# Patient Record
Sex: Female | Born: 1975 | Race: Black or African American | Hispanic: No | Smoking: Never smoker
Health system: Southern US, Community
[De-identification: ages and names within clinical notes are randomized; demographics above are authoritative.]

## PROBLEM LIST (undated history)

## (undated) DIAGNOSIS — I10 Essential (primary) hypertension: Secondary | ICD-10-CM

## (undated) HISTORY — PX: CHOLECYSTECTOMY: SHX55

## (undated) HISTORY — PX: BACK SURGERY: SHX140

---

## 2011-07-19 ENCOUNTER — Emergency Department (HOSPITAL_BASED_OUTPATIENT_CLINIC_OR_DEPARTMENT_OTHER)
Admission: EM | Admit: 2011-07-19 | Discharge: 2011-07-19 | Disposition: A | Discharge status: Home / Self Care | Payer: Self-pay | Attending: Emergency Medicine | Admitting: Emergency Medicine

## 2011-07-19 ENCOUNTER — Encounter: Payer: Self-pay | Admitting: *Deleted

## 2011-07-19 DIAGNOSIS — A599 Trichomoniasis, unspecified: Secondary | ICD-10-CM | POA: Insufficient documentation

## 2011-07-19 DIAGNOSIS — R109 Unspecified abdominal pain: Secondary | ICD-10-CM | POA: Insufficient documentation

## 2011-07-19 LAB — URINALYSIS, ROUTINE W REFLEX MICROSCOPIC
Ketones, ur: NEGATIVE mg/dL
Nitrite: POSITIVE — AB
Protein, ur: NEGATIVE mg/dL

## 2011-07-19 LAB — GC/CHLAMYDIA PROBE AMP, GENITAL
Chlamydia, DNA Probe: NEGATIVE
GC Probe Amp, Genital: NEGATIVE

## 2011-07-19 LAB — URINE MICROSCOPIC-ADD ON

## 2011-07-19 MED ORDER — DOXYCYCLINE HYCLATE 100 MG PO CAPS: 100.0000 mg | ORAL_CAPSULE | Freq: Two times a day (BID) | ORAL | Status: AC

## 2011-07-19 MED ORDER — DOXYCYCLINE HYCLATE 100 MG PO TABS
100.0000 mg | ORAL_TABLET | Freq: Once | ORAL | Status: AC
  Administered 2011-07-19: 100 mg via ORAL
  Filled 2011-07-19: qty 1

## 2011-07-19 MED ORDER — METRONIDAZOLE 500 MG PO TABS
2000.0000 mg | ORAL_TABLET | Freq: Once | ORAL | Status: AC
  Administered 2011-07-19: 2000 mg via ORAL
  Filled 2011-07-19: qty 4

## 2011-07-19 MED ORDER — LIDOCAINE HCL (PF) 1 % IJ SOLN
INTRAMUSCULAR | Status: AC
  Administered 2011-07-19: 04:00:00 via INTRAMUSCULAR
  Filled 2011-07-19: qty 5

## 2011-07-19 MED ORDER — CEFTRIAXONE SODIUM 250 MG IJ SOLR
250.0000 mg | INTRAMUSCULAR | Status: DC
  Administered 2011-07-19 (×2): 250 mg via INTRAMUSCULAR
  Filled 2011-07-19: qty 250

## 2011-07-19 NOTE — ED Provider Notes (Signed)
History     CSN: [id number] Arrival date & time: 07/19/2011  1:42 AM  Chief Complaint  Patient presents with  . Abdominal Cramping    (Consider location/radiation/quality/duration/timing/severity/associated sxs/prior treatment) HPI This 35 year old female has 3-5 days of intermittent suprapubic crampy abdominal pain which lasts up to an hour at a time without radiation or associated symptoms. She at baseline has a mild vaginal discharge and is unsure whether or not that is worse than usual or not. She has no vaginal bleeding and no dysuria. She is no fever no rash no dizziness chest pain or shortness of breath. She has no other concerns at this time. She has not had any joint swelling or joint redness. History reviewed. No pertinent past medical history.  Chole History reviewed. No pertinent family history.  History  Substance Use Topics  . Smoking status: Never Smoker   . Smokeless tobacco: Not on file  . Alcohol Use: No    OB History    Grav Para Term Preterm Abortions TAB SAB Ect Mult Living                  Review of Systems  Constitutional: Negative for fever.       10 Systems reviewed and are negative for acute change except as noted in the HPI.  HENT: Negative for congestion.   Eyes: Negative for discharge and redness.  Respiratory: Negative for cough and shortness of breath.   Cardiovascular: Negative for chest pain.  Gastrointestinal: Positive for abdominal pain. Negative for nausea, vomiting and diarrhea.  Genitourinary: Positive for vaginal discharge. Negative for dysuria and vaginal bleeding.  Musculoskeletal: Negative for back pain.  Skin: Negative for rash.  Neurological: Negative for syncope, numbness and headaches.  Psychiatric/Behavioral:       No behavior change.    Allergies  Review of patient's allergies indicates no known allergies.  Home Medications   Current Outpatient Rx  Name Route Sig Dispense Refill  . DOXYCYCLINE HYCLATE 100 MG PO CAPS  Oral Take 1 capsule (100 mg total) by mouth 2 (two) times daily. One po bid x 7 days 14 capsule 0    BP 177/129  Pulse 64  Temp(Src) 98.1 F (36.7 C) (Oral)  Resp 16  Ht 5\' 4"  (1.626 m)  Wt 170 lb (77.111 kg)  BMI 29.18 kg/m2  SpO2 99%  LMP 06/20/2011  Physical Exam  Nursing note and vitals reviewed. Constitutional:       Awake, alert, nontoxic appearance.  HENT:  Head: Atraumatic.  Eyes: Right eye exhibits no discharge. Left eye exhibits no discharge.  Neck: Neck supple.  Cardiovascular: Normal rate, regular rhythm and normal heart sounds.   No murmur heard. Pulmonary/Chest: Effort normal and breath sounds normal. No respiratory distress. She has no wheezes. She has no rales. She exhibits no tenderness.  Abdominal: Soft. Bowel sounds are normal. She exhibits no mass. There is tenderness. There is no rebound and no guarding.       She has minimal suprapubic tenderness only the rest of the abdomen including McBurney's point is nontender  Musculoskeletal: She exhibits no tenderness.       Baseline ROM, no obvious new focal weakness.  Neurological:       Mental status and motor strength appears baseline for patient and situation.  Skin: No rash noted.  Psychiatric: She has a normal mood and affect.  GU:  A chaperone was present for the pelvic examination which revealed minimal white vaginal discharge with no cervical motion  tenderness or adnexal tenderness or masses palpated. There was no rash noted on the external genitalia.  ED Course  Procedures (including critical care time)  Labs Reviewed  URINALYSIS, ROUTINE W REFLEX MICROSCOPIC - Abnormal; Notable for the following:    Nitrite POSITIVE (*)    Leukocytes, UA TRACE (*)    All other components within normal limits  URINE MICROSCOPIC-ADD ON - Abnormal; Notable for the following:    Bacteria, UA FEW (*)    All other components within normal limits  PREGNANCY, URINE  GC/CHLAMYDIA PROBE AMP, GENITAL   No results  found.   1. Trichimoniasis       MDM  I doubt any other EMC precluding discharge at this time including, but not necessarily limited to the following:sepsis, PID, peritonitis.        Hurman Horn, MD 07/19/11 [phone]

## 2011-07-19 NOTE — ED Notes (Signed)
C/o lower abd cramping x 4 days, pt states having normal vaginal d/c

## 2011-07-19 NOTE — ED Notes (Signed)
MD at bedside. 

## 2011-07-19 NOTE — ED Notes (Signed)
Pt c/o intermittent lower abd cramping x 4 days. Pt states she is having normal vaginal d/c and denies odor.

## 2011-07-19 NOTE — ED Notes (Signed)
Pt was only given one dose of IM rocephin 250 mg per MD order.

## 2013-01-01 ENCOUNTER — Encounter (HOSPITAL_BASED_OUTPATIENT_CLINIC_OR_DEPARTMENT_OTHER): Payer: Self-pay | Admitting: *Deleted

## 2013-01-01 ENCOUNTER — Emergency Department (HOSPITAL_BASED_OUTPATIENT_CLINIC_OR_DEPARTMENT_OTHER)
Admission: EM | Admit: 2013-01-01 | Discharge: 2013-01-01 | Disposition: A | Discharge status: Home / Self Care | Payer: Self-pay | Attending: Emergency Medicine | Admitting: Emergency Medicine

## 2013-01-01 ENCOUNTER — Emergency Department (HOSPITAL_BASED_OUTPATIENT_CLINIC_OR_DEPARTMENT_OTHER): Payer: Self-pay

## 2013-01-01 DIAGNOSIS — R42 Dizziness and giddiness: Secondary | ICD-10-CM | POA: Insufficient documentation

## 2013-01-01 DIAGNOSIS — I10 Essential (primary) hypertension: Secondary | ICD-10-CM | POA: Insufficient documentation

## 2013-01-01 DIAGNOSIS — R079 Chest pain, unspecified: Secondary | ICD-10-CM | POA: Insufficient documentation

## 2013-01-01 HISTORY — DX: Essential (primary) hypertension: I10

## 2013-01-01 LAB — BASIC METABOLIC PANEL
GFR calc non Af Amer: >90 mL/min (ref >90)
Glucose, Bld: 100 mg/dL — ABNORMAL HIGH (ref 70–99)
Potassium: 3.7 mEq/L (ref 3.5–5.1)
Sodium: 139 mEq/L (ref 135–145)

## 2013-01-01 LAB — CBC
Hemoglobin: 13 g/dL (ref 12.0–15.0)
MCHC: 34.7 g/dL (ref 30.0–36.0)
Platelets: 242 10*3/uL (ref 150–400)
RBC: 4.12 MIL/uL (ref 3.87–5.11)

## 2013-01-01 LAB — TROPONIN I: Troponin I: <0.3 ng/mL (ref <0.30)

## 2013-01-01 MED ORDER — LISINOPRIL 10 MG PO TABS: 10.0000 mg | ORAL_TABLET | Freq: Every day | ORAL | Status: DC

## 2013-01-01 MED ORDER — MORPHINE SULFATE 4 MG/ML IJ SOLN
4.0000 mg | Freq: Once | INTRAMUSCULAR | Status: AC
  Administered 2013-01-01: 4 mg via INTRAVENOUS
  Filled 2013-01-01: qty 1

## 2013-01-01 MED ORDER — ONDANSETRON HCL 4 MG/2ML IJ SOLN
4.0000 mg | Freq: Once | INTRAMUSCULAR | Status: AC
Start: 2013-01-01 — End: 2013-01-01
  Administered 2013-01-01: 4 mg via INTRAVENOUS
  Filled 2013-01-01: qty 2

## 2013-01-01 NOTE — ED Notes (Addendum)
Pt states she started having chest pain 2 weeks ago. States she feels "fluttering and pinching" in her chest.Pt states she felt n/v along with dizziness and lightheadedness last night but states she no longer feels dizzy or nauseous. Pt states she went to the ED Monday morning due to current symptoms and blood pressure was elevated but all results were normal.
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
## 2013-01-01 NOTE — ED Notes (Signed)
Patient changed into gown.

## 2013-01-01 NOTE — ED Provider Notes (Signed)
Medical screening examination/treatment/procedure(s) were performed by non-physician practitioner and as supervising physician I was immediately available for consultation/collaboration.   Richardean Canal, MD 01/01/13 [phone]

## 2013-01-01 NOTE — ED Provider Notes (Signed)
History     CSN: [id number]  Arrival date & time 01/01/13  Rickey Primus   First MD Initiated Contact with Patient 01/01/13 1850      Chief Complaint  Patient presents with  . Chest Pain    (Consider location/radiation/quality/duration/timing/severity/associated sxs/prior treatment) HPI Comments: Pt states that she has had pinching and flutttering in her chest over the left 2 weeks:pt was seen in the er [facility] last night but went home and took some lisinopril and then in the middle of the the night she was dizzy and nauseated:pt states that she hasn't been on bp medication in months  Patient is a 37 y.o. female presenting with chest pain. The history is provided by the patient. No language interpreter was used.  Chest Pain Pain location:  L chest Pain quality comment:  Pinching Pain radiates to:  Does not radiate Pain radiates to the back: no   Pain severity:  Mild Onset quality:  Gradual Duration:  2 weeks Timing:  Intermittent Progression:  Unchanged Context: not breathing and no drug use   Relieved by:  Nothing Worsened by:  Nothing tried Ineffective treatments:  None tried Associated symptoms: dizziness   Associated symptoms: no abdominal pain, no fever, no headache and no numbness     Past Medical History  Diagnosis Date  . Hypertension     Past Surgical History  Procedure Laterality Date  . Cholecystectomy      No family history on file.  History  Substance Use Topics  . Smoking status: Never Smoker   . Smokeless tobacco: Not on file  . Alcohol Use: No    OB History   Grav Para Term Preterm Abortions TAB SAB Ect Mult Living                  Review of Systems  Constitutional: Negative for fever.  Cardiovascular: Positive for chest pain.  Gastrointestinal: Negative for abdominal pain.  Neurological: Positive for dizziness. Negative for numbness and headaches.    Allergies  Review of patient's allergies indicates no known allergies.  Home Medications   No current outpatient prescriptions on file.  BP 164/102  Pulse 73  Resp 14  Wt 173 lb (78.472 kg)  BMI 29.68 kg/m2  SpO2 100%  LMP 12/29/2012  Physical Exam  Nursing note and vitals reviewed. Constitutional: She is oriented to person, place, and time. She appears well-developed and well-nourished.  HENT:  Head: Normocephalic and atraumatic.  Right Ear: External ear normal.  Left Ear: External ear normal.  Eyes: Conjunctivae and EOM are normal. Pupils are equal, round, and reactive to light.  Neck: Normal range of motion. Neck supple.  Cardiovascular: Normal rate and regular rhythm.   Pulmonary/Chest: Effort normal and breath sounds normal. She exhibits no tenderness.  Abdominal: Soft. Bowel sounds are normal.  Musculoskeletal: Normal range of motion.  Neurological: She is alert and oriented to person, place, and time.  Skin: Skin is warm and dry.  Psychiatric: She has a normal mood and affect.    ED Course  Procedures (including critical care time)  Labs Reviewed  BASIC METABOLIC PANEL - Abnormal; Notable for the following:    Glucose, Bld 100 (*)    All other components within normal limits  CBC  TROPONIN I   Dg Chest 2 View  01/01/2013  *RADIOLOGY REPORT*  Clinical Data: 2-week history of palpitations and dizziness.  CHEST - 2 VIEW  Comparison: None.  Findings: Suboptimal inspiration due to body habitus which accounts for  crowded bronchovascular markings at the bases and accentuates the cardiac silhouette.  Taking this into account, cardiomediastinal silhouette unremarkable.  Lungs clear. Bronchovascular markings normal.  Pulmonary vascularity normal.  No pneumothorax.  No pleural effusions.  Visualized bony thorax intact.  IMPRESSION: Suboptimal inspiration.  No acute cardiopulmonary disease.   Original Report Authenticated By: Hulan Saas, M.D.    01/01/2013  Date: 01/01/2013  Rate: 69  Rhythm: sinus arrhythmia  QRS Axis: normal  Intervals: normal  ST/T Wave  abnormalities: normal  Conduction Disutrbances:none  Narrative Interpretation:   Old EKG Reviewed: none available    1. Chest pain   2. HTN (hypertension)       MDM  Doubt cardiac in nature:pt is cp free at this time:pt symptoms have been on going for 2 weeks;1 troponin sufficient:will give script for htn        Teressa Lower, NP 01/01/13 2036

## 2013-01-01 NOTE — ED Notes (Signed)
States she has been having fluttering in her chest x 2 weeks. Was seen at [facility] yesterday and everything was negative. Last night she started having dizziness. She woke lightheaded. None now.

## 2015-05-19 ENCOUNTER — Emergency Department (HOSPITAL_BASED_OUTPATIENT_CLINIC_OR_DEPARTMENT_OTHER)
Admission: EM | Admit: 2015-05-19 | Discharge: 2015-05-20 | Disposition: A | Discharge status: Home / Self Care | Payer: Self-pay | Attending: Emergency Medicine | Admitting: Emergency Medicine

## 2015-05-19 ENCOUNTER — Emergency Department (HOSPITAL_BASED_OUTPATIENT_CLINIC_OR_DEPARTMENT_OTHER): Payer: Self-pay

## 2015-05-19 ENCOUNTER — Encounter (HOSPITAL_BASED_OUTPATIENT_CLINIC_OR_DEPARTMENT_OTHER): Payer: Self-pay | Admitting: Emergency Medicine

## 2015-05-19 DIAGNOSIS — Y998 Other external cause status: Secondary | ICD-10-CM | POA: Insufficient documentation

## 2015-05-19 DIAGNOSIS — S060X0A Concussion without loss of consciousness, initial encounter: Secondary | ICD-10-CM | POA: Insufficient documentation

## 2015-05-19 DIAGNOSIS — I10 Essential (primary) hypertension: Secondary | ICD-10-CM | POA: Insufficient documentation

## 2015-05-19 DIAGNOSIS — Y9289 Other specified places as the place of occurrence of the external cause: Secondary | ICD-10-CM | POA: Insufficient documentation

## 2015-05-19 DIAGNOSIS — Y9389 Activity, other specified: Secondary | ICD-10-CM | POA: Insufficient documentation

## 2015-05-19 DIAGNOSIS — W2189XA Striking against or struck by other sports equipment, initial encounter: Secondary | ICD-10-CM | POA: Insufficient documentation

## 2015-05-19 MED ORDER — DIPHENHYDRAMINE HCL 50 MG/ML IJ SOLN
25.0000 mg | Freq: Once | INTRAMUSCULAR | Status: AC
  Administered 2015-05-19: 25 mg via INTRAVENOUS
  Filled 2015-05-19: qty 1

## 2015-05-19 MED ORDER — SODIUM CHLORIDE 0.9 % IV BOLUS (SEPSIS)
1000.0000 mL | Freq: Once | INTRAVENOUS | Status: AC
  Administered 2015-05-19: 1000 mL via INTRAVENOUS

## 2015-05-19 MED ORDER — METOCLOPRAMIDE HCL 5 MG/ML IJ SOLN
10.0000 mg | Freq: Once | INTRAMUSCULAR | Status: AC
  Administered 2015-05-19: 10 mg via INTRAVENOUS
  Filled 2015-05-19: qty 2
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
## 2015-05-19 NOTE — ED Notes (Signed)
Reports right eye vision is "cloudy"
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
## 2015-05-20 NOTE — Discharge Instructions (Signed)
Your blood pressure was elevated today - please follow up with your family doctor for recheck, you may need to restart your blood pressure medications.     Concussion A concussion, or closed-head injury, is a brain injury caused by a direct blow to the head or by a quick and sudden movement (jolt) of the head or neck. Concussions are usually not life-threatening. Even so, the effects of a concussion can be serious. If you have had a concussion before, you are more likely to experience concussion-like symptoms after a direct blow to the head.  CAUSES  Direct blow to the head, such as from running into another player during a soccer game, being hit in a fight, or hitting your head on a hard surface.  A jolt of the head or neck that causes the brain to move back and forth inside the skull, such as in a car crash. SIGNS AND SYMPTOMS The signs of a concussion can be hard to notice. Early on, they may be missed by you, family members, and health care providers. You may look fine but act or feel differently. Symptoms are usually temporary, but they may last for days, weeks, or even longer. Some symptoms may appear right away while others may not show up for hours or days. Every head injury is different. Symptoms include:  Mild to moderate headaches that will not go away.  A feeling of pressure inside your head.  Having more trouble than usual:  Learning or remembering things you have heard.  Answering questions.  Paying attention or concentrating.  Organizing daily tasks.  Making decisions and solving problems.  Slowness in thinking, acting or reacting, speaking, or reading.  Getting lost or being easily confused.  Feeling tired all the time or lacking energy (fatigued).  Feeling drowsy.  Sleep disturbances.  Sleeping more than usual.  Sleeping less than usual.  Trouble falling asleep.  Trouble sleeping (insomnia).  Loss of balance or feeling lightheaded or dizzy.  Nausea  or vomiting.  Numbness or tingling.  Increased sensitivity to:  Sounds.  Lights.  Distractions.  Vision problems or eyes that tire easily.  Diminished sense of taste or smell.  Ringing in the ears.  Mood changes such as feeling sad or anxious.  Becoming easily irritated or angry for little or no reason.  Lack of motivation.  Seeing or hearing things other people do not see or hear (hallucinations). DIAGNOSIS Your health care provider can usually diagnose a concussion based on a description of your injury and symptoms. He or she will ask whether you passed out (lost consciousness) and whether you are having trouble remembering events that happened right before and during your injury. Your evaluation might include:  A brain scan to look for signs of injury to the brain. Even if the test shows no injury, you may still have a concussion.  Blood tests to be sure other problems are not present. TREATMENT  Concussions are usually treated in an emergency department, in urgent care, or at a clinic. You may need to stay in the hospital overnight for further treatment.  Tell your health care provider if you are taking any medicines, including prescription medicines, over-the-counter medicines, and natural remedies. Some medicines, such as blood thinners (anticoagulants) and aspirin, may increase the chance of complications. Also tell your health care provider whether you have had alcohol or are taking illegal drugs. This information may affect treatment.  Your health care provider will send you home with important instructions to follow.  How fast you will recover from a concussion depends on many factors. These factors include how severe your concussion is, what part of your brain was injured, your age, and how healthy you were before the concussion.  Most people with mild injuries recover fully. Recovery can take time. In general, recovery is slower in older persons. Also, persons  who have had a concussion in the past or have other medical problems may find that it takes longer to recover from their current injury. HOME CARE INSTRUCTIONS General Instructions  Carefully follow the directions your health care provider gave you.  Only take over-the-counter or prescription medicines for pain, discomfort, or fever as directed by your health care provider.  Take only those medicines that your health care provider has approved.  Do not drink alcohol until your health care provider says you are well enough to do so. Alcohol and certain other drugs may slow your recovery and can put you at risk of further injury.  If it is harder than usual to remember things, write them down.  If you are easily distracted, try to do one thing at a time. For example, do not try to watch TV while fixing dinner.  Talk with family members or close friends when making important decisions.  Keep all follow-up appointments. Repeated evaluation of your symptoms is recommended for your recovery.  Watch your symptoms and tell others to do the same. Complications sometimes occur after a concussion. Older adults with a brain injury may have a higher risk of serious complications, such as a blood clot on the brain.  Tell your teachers, school nurse, school counselor, coach, athletic trainer, or work Production designer, theatre/television/film about your injury, symptoms, and restrictions. Tell them about what you can or cannot do. They should watch for:  Increased problems with attention or concentration.  Increased difficulty remembering or learning new information.  Increased time needed to complete tasks or assignments.  Increased irritability or decreased ability to cope with stress.  Increased symptoms.  Rest. Rest helps the brain to heal. Make sure you:  Get plenty of sleep at night. Avoid staying up late at night.  Keep the same bedtime hours on weekends and weekdays.  Rest during the day. Take daytime naps or rest  breaks when you feel tired.  Limit activities that require a lot of thought or concentration. These include:  Doing homework or job-related work.  Watching TV.  Working on the computer.  Avoid any situation where there is potential for another head injury (football, hockey, soccer, basketball, martial arts, downhill snow sports and horseback riding). Your condition will get worse every time you experience a concussion. You should avoid these activities until you are evaluated by the appropriate follow-up health care providers. Returning To Your Regular Activities You will need to return to your normal activities slowly, not all at once. You must give your body and brain enough time for recovery.  Do not return to sports or other athletic activities until your health care provider tells you it is safe to do so.  Ask your health care provider when you can drive, ride a bicycle, or operate heavy machinery. Your ability to react may be slower after a brain injury. Never do these activities if you are dizzy.  Ask your health care provider about when you can return to work or school. Preventing Another Concussion It is very important to avoid another brain injury, especially before you have recovered. In rare cases, another injury can lead to permanent  brain damage, brain swelling, or death. The risk of this is greatest during the first 7-10 days after a head injury. Avoid injuries by:  Wearing a seat belt when riding in a car.  Drinking alcohol only in moderation.  Wearing a helmet when biking, skiing, skateboarding, skating, or doing similar activities.  Avoiding activities that could lead to a second concussion, such as contact or recreational sports, until your health care provider says it is okay.  Taking safety measures in your home.  Remove clutter and tripping hazards from floors and stairways.  Use grab bars in bathrooms and handrails by stairs.  Place non-slip mats on floors  and in bathtubs.  Improve lighting in dim areas. SEEK MEDICAL CARE IF:  You have increased problems paying attention or concentrating.  You have increased difficulty remembering or learning new information.  You need more time to complete tasks or assignments than before.  You have increased irritability or decreased ability to cope with stress.  You have more symptoms than before. Seek medical care if you have any of the following symptoms for more than 2 weeks after your injury:  Lasting (chronic) headaches.  Dizziness or balance problems.  Nausea.  Vision problems.  Increased sensitivity to noise or light.  Depression or mood swings.  Anxiety or irritability.  Memory problems.  Difficulty concentrating or paying attention.  Sleep problems.  Feeling tired all the time. SEEK IMMEDIATE MEDICAL CARE IF:  You have severe or worsening headaches. These may be a sign of a blood clot in the brain.  You have weakness (even if only in one hand, leg, or part of the face).  You have numbness.  You have decreased coordination.  You vomit repeatedly.  You have increased sleepiness.  One pupil is larger than the other.  You have convulsions.  You have slurred speech.  You have increased confusion. This may be a sign of a blood clot in the brain.  You have increased restlessness, agitation, or irritability.  You are unable to recognize people or places.  You have neck pain.  It is difficult to wake you up.  You have unusual behavior changes.  You lose consciousness. MAKE SURE YOU:  Understand these instructions.  Will watch your condition.  Will get help right away if you are not doing well or get worse. Document Released: 12/24/2003 Document Revised: 10/08/2013 Document Reviewed: 04/25/2013 [facility] Patient Information 2015 [place], [place]. This information is not intended to replace advice given to you by your health care provider. Make sure you  discuss any questions you have with your health care provider.

## 2016-04-09 ENCOUNTER — Emergency Department (HOSPITAL_BASED_OUTPATIENT_CLINIC_OR_DEPARTMENT_OTHER): Payer: Self-pay

## 2016-04-09 ENCOUNTER — Encounter (HOSPITAL_BASED_OUTPATIENT_CLINIC_OR_DEPARTMENT_OTHER): Payer: Self-pay | Admitting: Emergency Medicine

## 2016-04-09 ENCOUNTER — Other Ambulatory Visit: Payer: Self-pay

## 2016-04-09 DIAGNOSIS — I10 Essential (primary) hypertension: Secondary | ICD-10-CM | POA: Insufficient documentation

## 2016-04-09 DIAGNOSIS — M62838 Other muscle spasm: Secondary | ICD-10-CM | POA: Insufficient documentation

## 2016-04-09 DIAGNOSIS — R0789 Other chest pain: Secondary | ICD-10-CM | POA: Insufficient documentation

## 2016-04-09 DIAGNOSIS — Z7982 Long term (current) use of aspirin: Secondary | ICD-10-CM | POA: Insufficient documentation

## 2016-04-09 LAB — BASIC METABOLIC PANEL
ANION GAP: 7 (ref 5–15)
BUN: 13 mg/dL (ref 6–20)
CALCIUM: 9.2 mg/dL (ref 8.9–10.3)
CO2: 26 mmol/L (ref 22–32)
Chloride: 106 mmol/L (ref 101–111)
Creatinine, Ser: 0.75 mg/dL (ref 0.44–1.00)
GLUCOSE: 106 mg/dL — AB (ref 65–99)
POTASSIUM: 3.8 mmol/L (ref 3.5–5.1)
SODIUM: 139 mmol/L (ref 135–145)

## 2016-04-09 LAB — CBC
HEMATOCRIT: 35.8 % — AB (ref 36.0–46.0)
HEMOGLOBIN: 12.5 g/dL (ref 12.0–15.0)
MCH: 32.1 pg (ref 26.0–34.0)
MCHC: 34.9 g/dL (ref 30.0–36.0)
MCV: 91.8 fL (ref 78.0–100.0)
Platelets: 264 10*3/uL (ref 150–400)
RBC: 3.9 MIL/uL (ref 3.87–5.11)
RDW: 13.2 % (ref 11.5–15.5)
WBC: 5 10*3/uL (ref 4.0–10.5)

## 2016-04-09 LAB — TROPONIN I

## 2016-04-09 NOTE — ED Notes (Signed)
Patient reports chest pain that started two days ago. Patient states her pain is the left side of her chest, radiates into her left neck, and through her chest into her mid back. Patient describes pain as stabbing, 8/10. Patient states she had this same pain one other time in the past and [facility], is unable to state when this occurred. Patient is A&Ox4, NAD noted. Patient reports she was laying down when the pain started.

## 2016-04-10 ENCOUNTER — Emergency Department (HOSPITAL_BASED_OUTPATIENT_CLINIC_OR_DEPARTMENT_OTHER)
Admission: EM | Admit: 2016-04-10 | Discharge: 2016-04-10 | Disposition: A | Discharge status: Home / Self Care | Payer: Self-pay | Attending: Emergency Medicine | Admitting: Emergency Medicine

## 2016-04-10 DIAGNOSIS — M62838 Other muscle spasm: Secondary | ICD-10-CM

## 2016-04-10 DIAGNOSIS — R0789 Other chest pain: Secondary | ICD-10-CM

## 2016-04-10 LAB — TROPONIN I: Troponin I: <0.03 ng/mL (ref <0.031)

## 2016-04-10 MED ORDER — DICLOFENAC SODIUM ER 100 MG PO TB24: 100.0000 mg | ORAL_TABLET | Freq: Every day | ORAL | Status: DC

## 2016-04-10 MED ORDER — METHOCARBAMOL 500 MG PO TABS: 500.0000 mg | ORAL_TABLET | Freq: Two times a day (BID) | ORAL | Status: DC

## 2016-04-10 MED ORDER — KETOROLAC TROMETHAMINE 60 MG/2ML IM SOLN
60.0000 mg | Freq: Once | INTRAMUSCULAR | Status: DC
  Filled 2016-04-10: qty 2

## 2016-04-10 MED ORDER — METHOCARBAMOL 500 MG PO TABS
1000.0000 mg | ORAL_TABLET | Freq: Once | ORAL | Status: DC
  Filled 2016-04-10: qty 2

## 2016-04-10 NOTE — Discharge Instructions (Signed)
Chest Wall Pain °Chest wall pain is pain in or around the bones and muscles of your chest. Sometimes, an injury causes this pain. Sometimes, the cause may not be known. This pain may take several weeks or longer to get better. °HOME CARE °Pay attention to any changes in your symptoms. Take these actions to help with your pain: °· Rest as told by your doctor. °· Avoid activities that cause pain. Try not to use your chest, belly (abdominal), or side muscles to lift heavy things. °· If directed, apply ice to the painful area: °¨ Put ice in a plastic bag. °¨ Place a towel between your skin and the bag. °¨ Leave the ice on for 20 minutes, 2-3 times per day. °· Take over-the-counter and prescription medicines only as told by your doctor. °· Do not use tobacco products, including cigarettes, chewing tobacco, and e-cigarettes. If you need help quitting, ask your doctor. °· Keep all follow-up visits as told by your doctor. This is important. °GET HELP IF: °· You have a fever. °· Your chest pain gets worse. °· You have new symptoms. °GET HELP RIGHT AWAY IF: °· You feel sick to your stomach (nauseous) or you throw up (vomit). °· You feel sweaty or light-headed. °· You have a cough with phlegm (sputum) or you cough up blood. °· You are short of breath. °  °This information is not intended to replace advice given to you by your health care provider. Make sure you discuss any questions you have with your health care provider. °  °Document Released: 03/21/2008 Document Revised: 06/24/2015 Document Reviewed: 12/29/2014 °Elsevier Interactive Patient Education ©2016 [employer] ° °

## 2016-04-10 NOTE — ED Notes (Signed)
Patient is resting quietly, friend at bedside. Patient was updated on plan of care to include repeating blood work to ensure labs are remaining normal and pain medication. Patient states her pain is the same as when she arrived, patient was offered Toradol and Robaxin, refused both. Blood work collected and taken to lab.

## 2016-04-11 ENCOUNTER — Encounter (HOSPITAL_BASED_OUTPATIENT_CLINIC_OR_DEPARTMENT_OTHER): Payer: Self-pay | Admitting: Emergency Medicine

## 2016-04-11 NOTE — ED Provider Notes (Signed)
CSN: [id number]     Arrival date & time 04/09/16  2309 History   First MD Initiated Contact with Patient 04/10/16 0209     Chief Complaint  Patient presents with  . Chest Pain     (Consider location/radiation/quality/duration/timing/severity/associated sxs/prior Treatment) Patient is a 40 y.o. female presenting with chest pain. The history is provided by the patient.  Chest Pain Pain location:  L chest Pain quality: sharp   Radiates to: has neck pain but it is not contigious  Pain radiates to the back: no   Pain severity:  Severe Onset quality:  Gradual Duration:  2 days Timing:  Constant Progression:  Unchanged Chronicity:  Recurrent Context: movement and raising an arm   Context: not breathing, not lifting and no trauma   Relieved by:  Nothing Worsened by:  Nothing tried Ineffective treatments:  None tried Associated symptoms: no abdominal pain, no back pain, no diaphoresis, no fever, no palpitations and no shortness of breath   Risk factors: no aortic disease     Past Medical History  Diagnosis Date  . Hypertension    Past Surgical History  Procedure Laterality Date  . Cholecystectomy     History reviewed. No pertinent family history. Social History  Substance Use Topics  . Smoking status: Never Smoker   . Smokeless tobacco: None  . Alcohol Use: No   OB History    No data available     Review of Systems  Constitutional: Negative for fever and diaphoresis.  Respiratory: Negative for shortness of breath.   Cardiovascular: Positive for chest pain. Negative for palpitations and leg swelling.  Gastrointestinal: Negative for abdominal pain.  Musculoskeletal: Negative for back pain.  All other systems reviewed and are negative.     Allergies  Review of patient's allergies indicates no known allergies.  Home Medications   Prior to Admission medications   Medication Sig Start Date End Date Taking? Authorizing Provider  aspirin-acetaminophen-caffeine  (EXCEDRIN MIGRAINE) [phone] MG tablet Take 2 tablets by mouth once.   Yes Historical Provider, MD  Diclofenac Sodium CR (VOLTAREN-XR) 100 MG 24 hr tablet Take 1 tablet (100 mg total) by mouth daily. 04/10/16   Darby Fleeman, MD  methocarbamol (ROBAXIN) 500 MG tablet Take 1 tablet (500 mg total) by mouth 2 (two) times daily. 04/10/16   Tam Savoia, MD   BP 156/110 mmHg  Pulse 74  Temp(Src) 98.5 F (36.9 C) (Oral)  Resp 16  Ht 5\' 2"  (1.575 m)  Wt 150 lb (68.04 kg)  BMI 27.43 kg/m2  SpO2 99%  LMP 03/21/2016 (Approximate) Physical Exam  Constitutional: She is oriented to person, place, and time. She appears well-developed and well-nourished. No distress.  HENT:  Head: Normocephalic and atraumatic.  Mouth/Throat: Oropharynx is clear and moist.  Eyes: Conjunctivae are normal. Pupils are equal, round, and reactive to light.  Neck: Normal range of motion. Neck supple.  Cardiovascular: Normal rate and intact distal pulses.   Pulmonary/Chest: Effort normal and breath sounds normal. No respiratory distress. She has no wheezes. She has no rales. She exhibits tenderness.  And left trapezius spasm  Abdominal: Soft. Bowel sounds are normal. There is no tenderness. There is no rebound and no guarding.  Musculoskeletal: Normal range of motion. She exhibits no edema or tenderness.  Neurological: She is alert and oriented to person, place, and time. She has normal reflexes.  Skin: Skin is warm and dry. She is not diaphoretic.  Psychiatric: She has a normal mood and affect.  ED Course  Procedures (including critical care time) Labs Review Labs Reviewed  BASIC METABOLIC PANEL - Abnormal; Notable for the following:    Glucose, Bld 106 (*)    All other components within normal limits  CBC - Abnormal; Notable for the following:    HCT 35.8 (*)    All other components within normal limits  TROPONIN I  TROPONIN I    Imaging Review Dg Chest 2 View  04/09/2016  CLINICAL DATA:  Acute onset of  left-sided chest pain, radiating to the left side of the neck and mid back. Initial encounter. EXAM: CHEST  2 VIEW COMPARISON:  Chest radiograph performed 01/01/2013 FINDINGS: The lungs are well-aerated and clear. There is no evidence of focal opacification, pleural effusion or pneumothorax. The heart is normal in size; the mediastinal contour is within normal limits. No acute osseous abnormalities are seen. Clips are noted within the right upper quadrant, reflecting prior cholecystectomy. IMPRESSION: No acute cardiopulmonary process seen. Electronically Signed   By: Roanna RaiderJeffery  Chang M.D.   On: 04/09/2016 23:58   I have personally reviewed and evaluated these images and lab results as part of my medical decision-making.   EKG Interpretation   Date/Time:  Saturday April 09 2016 23:19:50 EDT Ventricular Rate:  67 PR Interval:  132 QRS Duration: 70 QT Interval:  396 QTC Calculation: 418 R Axis:   22 Text Interpretation:  Normal sinus rhythm with sinus arrhythmia Confirmed  by [facility]  MD, Morene AntuAPRIL ([id number]) on 04/10/2016 2:19:49 AM Also confirmed  by [facility]  MD, Lexus Shampine ([id number]), editor Whitney PostLOGAN, Cala BradfordKIMBERLY [phone])  on  04/10/2016 9:59:17 AM      MDM   Final diagnoses:  Chest wall pain  Muscle spasm   Filed Vitals:   04/09/16 2318 04/10/16 0240  BP: 158/114 156/110  Pulse: 64 74  Temp: 98.5 F (36.9 C)   Resp: 16 16    Results for orders placed or performed during the hospital encounter of 04/10/16  Basic metabolic panel  Result Value Ref Range   Sodium 139 135 - 145 mmol/L   Potassium 3.8 3.5 - 5.1 mmol/L   Chloride 106 101 - 111 mmol/L   CO2 26 22 - 32 mmol/L   Glucose, Bld 106 (H) 65 - 99 mg/dL   BUN 13 6 - 20 mg/dL   Creatinine, Ser 9.140.75 0.44 - 1.00 mg/dL   Calcium 9.2 8.9 - 78.210.3 mg/dL   GFR calc non Af Amer >60 >60 mL/min   GFR calc Af Amer >60 >60 mL/min   Anion gap 7 5 - 15  CBC  Result Value Ref Range   WBC 5.0 4.0 - 10.5 K/uL   RBC 3.90 3.87 - 5.11 MIL/uL    Hemoglobin 12.5 12.0 - 15.0 g/dL   HCT 95.635.8 (L) 21.336.0 - 08.646.0 %   MCV 91.8 78.0 - 100.0 fL   MCH 32.1 26.0 - 34.0 pg   MCHC 34.9 30.0 - 36.0 g/dL   RDW 57.813.2 46.911.5 - 62.915.5 %   Platelets 264 150 - 400 K/uL  Troponin I  Result Value Ref Range   Troponin I <0.03 <0.031 ng/mL  Troponin I  Result Value Ref Range   Troponin I <0.03 <0.031 ng/mL   Dg Chest 2 View  04/09/2016  CLINICAL DATA:  Acute onset of left-sided chest pain, radiating to the left side of the neck and mid back. Initial encounter. EXAM: CHEST  2 VIEW COMPARISON:  Chest radiograph performed 01/01/2013 FINDINGS: The lungs are well-aerated and  clear. There is no evidence of focal opacification, pleural effusion or pneumothorax. The heart is normal in size; the mediastinal contour is within normal limits. No acute osseous abnormalities are seen. Clips are noted within the right upper quadrant, reflecting prior cholecystectomy. IMPRESSION: No acute cardiopulmonary process seen. Electronically Signed   By: Roanna Raider M.D.   On: 04/09/2016 23:58     Medication List    TAKE these medications        Diclofenac Sodium CR 100 MG 24 hr tablet  Commonly known as:  VOLTAREN-XR  Take 1 tablet (100 mg total) by mouth daily.     methocarbamol 500 MG tablet  Commonly known as:  ROBAXIN  Take 1 tablet (500 mg total) by mouth 2 (two) times daily.      ASK your doctor about these medications        aspirin-acetaminophen-caffeine 250-250-65 MG tablet  Commonly known as:  EXCEDRIN MIGRAINE  Take 2 tablets by mouth once.        Refused pain medication and muscle relaxant ordered in the ED  PERC negative wells 0 highly doubt PE in this very low risk patient  Sleeping in room upon entrance easily arousable.   Sleeping through entirety of the ED stay.   HEART score is 1 ruled out for ACs with a normal ekg and 2 negative delta troponins.  Symptoms are consistent with MSK pain and spasm.  Will D/c with NSAIDs and muscle relaxants close  follow up with your PMD.  Strict return precautions given    Akin Yi, MD 04/11/16 1610

## 2016-08-31 ENCOUNTER — Emergency Department (HOSPITAL_BASED_OUTPATIENT_CLINIC_OR_DEPARTMENT_OTHER)
Admission: EM | Admit: 2016-08-31 | Discharge: 2016-08-31 | Disposition: A | Discharge status: Home / Self Care | Payer: Self-pay | Attending: Physician Assistant | Admitting: Physician Assistant

## 2016-08-31 ENCOUNTER — Emergency Department (HOSPITAL_BASED_OUTPATIENT_CLINIC_OR_DEPARTMENT_OTHER): Payer: Self-pay

## 2016-08-31 ENCOUNTER — Encounter (HOSPITAL_BASED_OUTPATIENT_CLINIC_OR_DEPARTMENT_OTHER): Payer: Self-pay | Admitting: *Deleted

## 2016-08-31 DIAGNOSIS — N760 Acute vaginitis: Secondary | ICD-10-CM | POA: Insufficient documentation

## 2016-08-31 DIAGNOSIS — I10 Essential (primary) hypertension: Secondary | ICD-10-CM | POA: Insufficient documentation

## 2016-08-31 DIAGNOSIS — B9689 Other specified bacterial agents as the cause of diseases classified elsewhere: Secondary | ICD-10-CM

## 2016-08-31 DIAGNOSIS — R102 Pelvic and perineal pain: Secondary | ICD-10-CM

## 2016-08-31 LAB — URINALYSIS, ROUTINE W REFLEX MICROSCOPIC
Bilirubin Urine: NEGATIVE
GLUCOSE, UA: NEGATIVE mg/dL
HGB URINE DIPSTICK: NEGATIVE
Ketones, ur: NEGATIVE mg/dL
Leukocytes, UA: NEGATIVE
Nitrite: NEGATIVE
PH: 6 (ref 5.0–8.0)
Protein, ur: NEGATIVE mg/dL
SPECIFIC GRAVITY, URINE: 1.013 (ref 1.005–1.030)

## 2016-08-31 LAB — PREGNANCY, URINE: Preg Test, Ur: NEGATIVE

## 2016-08-31 LAB — WET PREP, GENITAL
SPERM: NONE SEEN
Trich, Wet Prep: NONE SEEN
Yeast Wet Prep HPF POC: NONE SEEN

## 2016-08-31 MED ORDER — METRONIDAZOLE 500 MG PO TABS
500.0000 mg | ORAL_TABLET | Freq: Once | ORAL | Status: AC
  Administered 2016-08-31: 500 mg via ORAL
  Filled 2016-08-31: qty 1

## 2016-08-31 MED ORDER — METRONIDAZOLE 500 MG PO TABS: 500.0000 mg | ORAL_TABLET | Freq: Two times a day (BID) | ORAL | 0 refills | Status: DC

## 2016-08-31 NOTE — ED Triage Notes (Signed)
Pt c/o vaginal discharge with right lower abd pain  X 2 days

## 2016-08-31 NOTE — Discharge Instructions (Signed)
You have bacterial vaginosis. Please take the prescription prescribed. If you have any concerns please return.  Also please have your BP checked by your primary care phsyician.

## 2016-08-31 NOTE — ED Provider Notes (Signed)
[facility] Provider Note   CSN: [id number] Arrival date & time: 08/31/16  1729  By signing my name below, I, Holly Barnett, attest that this documentation has been prepared under the direction and in the presence of physician practitioner, Aundrea Higginbotham Randall AnLyn Jeremias Broyhill, MD. Electronically Signed: Linna Darnerussell Barnett, Scribe. 08/31/2016. 6:42 PM.  History   Chief Complaint Chief Complaint  Patient presents with  . Vaginal Discharge    The history is provided by the patient. No language interpreter was used.     HPI Comments: Holly Barnett is a 40 y.o. female who presents to the Emergency Department complaining of sudden onset, constant, white-colored vaginal discharge for the last 4 days. She states it is not thick but it is malodorous. She notes associated lower abdominal pain that is worse on the right. Pt denies recent unprotected sex, h/o bacterial vaginosis, or current birth control use. She notes she has 3 children. Pt denies dysuria, fever, nausea, vomiting, diarrhea, or any other associated symptoms.  Past Medical History:  Diagnosis Date  . Hypertension     There are no active problems to display for this patient.   Past Surgical History:  Procedure Laterality Date  . CHOLECYSTECTOMY      OB History    No data available       Home Medications    Prior to Admission medications   Medication Sig Start Date End Date Taking? Authorizing Provider  metroNIDAZOLE (FLAGYL) 500 MG tablet Take 1 tablet (500 mg total) by mouth 2 (two) times daily. 08/31/16   Tedd Cottrill Lyn Ryver Zadrozny, MD    Family History No family history on file.  Social History Social History  Substance Use Topics  . Smoking status: Never Smoker  . Smokeless tobacco: Not on file  . Alcohol use No     Allergies   Patient has no known allergies.   Review of Systems Review of Systems  Constitutional: Negative for fever.  Gastrointestinal: Negative for diarrhea, nausea and vomiting.    Genitourinary: Positive for vaginal discharge. Negative for dysuria.  All other systems reviewed and are negative.   Physical Exam Updated Vital Signs BP 148/99 (BP Location: Right Arm)   Pulse 80   Temp 98.2 F (36.8 C) (Oral)   Resp 18   Ht 5\' 2"  (1.575 m)   Wt 160 lb (72.6 kg)   LMP 07/31/2016   SpO2 99%   BMI 29.26 kg/m   Physical Exam  Constitutional: She is oriented to person, place, and time. She appears well-developed and well-nourished. No distress.  HENT:  Head: Normocephalic and atraumatic.  Eyes: Conjunctivae and EOM are normal.  Neck: Neck supple. No tracheal deviation present.  Cardiovascular: Normal rate.   Pulmonary/Chest: Effort normal. No respiratory distress.  Genitourinary:  Genitourinary Comments: Vaginal exam: thin white discharge with fishy odor.  Musculoskeletal: Normal range of motion.  Neurological: She is alert and oriented to person, place, and time.  Skin: Skin is warm and dry.  Psychiatric: She has a normal mood and affect. Her behavior is normal.  Nursing note and vitals reviewed.   ED Treatments / Results  Labs (all labs ordered are listed, but only abnormal results are displayed) Labs Reviewed  WET PREP, GENITAL - Abnormal; Notable for the following:       Result Value   Clue Cells Wet Prep HPF POC PRESENT (*)    WBC, Wet Prep HPF POC MANY (*)    All other components within normal limits  PREGNANCY, URINE  URINALYSIS,  ROUTINE W REFLEX MICROSCOPIC (NOT [facility])  GC/CHLAMYDIA PROBE AMP ([place]) NOT [facility]    EKG  EKG Interpretation None       Radiology [place] Transvaginal Non-ob  Result Date: 08/31/2016 CLINICAL DATA:  Four day history of diffuse pelvic and right lower quadrant pain. EXAM: TRANSABDOMINAL AND TRANSVAGINAL ULTRASOUND OF PELVIS DOPPLER ULTRASOUND OF OVARIES TECHNIQUE: Both transabdominal and transvaginal ultrasound examinations of the pelvis were performed. Transabdominal technique was performed for global  imaging of the pelvis including uterus, ovaries, adnexal regions, and pelvic cul-de-sac. It was necessary to proceed with endovaginal exam following the transabdominal exam to visualize the uterus and ovaries. Color and duplex Doppler ultrasound was utilized to evaluate blood flow to the ovaries. COMPARISON:  None. FINDINGS: Uterus Measurements: 9.8 x 5.5 x 6.5 cm. Several intramural fibroids are evident, including a 1.8 cm anterior fundal lesion and a 1.5 cm lesion in the uterine body. Endometrium Thickness: 12 mm.  No focal abnormality visualized. Right ovary Measurements: 2.9 x 2.8 x 2.4 cm. Normal appearance/no adnexal mass. Left ovary Measurements: 2.4 x 1.0 x 1.0 cm. Normal appearance/no adnexal mass. Pulsed Doppler evaluation of both ovaries demonstrates normal low-resistance arterial and venous waveforms. Other findings No abnormal free fluid. IMPRESSION: No sonographic findings to explain the patient's history of right lower quadrant pain. Electronically Signed   By: Kennith CenterEric  Mansell M.D.   On: 08/31/2016 20:12   [place] Pelvis Complete  Result Date: 08/31/2016 CLINICAL DATA:  Four day history of diffuse pelvic and right lower quadrant pain. EXAM: TRANSABDOMINAL AND TRANSVAGINAL ULTRASOUND OF PELVIS DOPPLER ULTRASOUND OF OVARIES TECHNIQUE: Both transabdominal and transvaginal ultrasound examinations of the pelvis were performed. Transabdominal technique was performed for global imaging of the pelvis including uterus, ovaries, adnexal regions, and pelvic cul-de-sac. It was necessary to proceed with endovaginal exam following the transabdominal exam to visualize the uterus and ovaries. Color and duplex Doppler ultrasound was utilized to evaluate blood flow to the ovaries. COMPARISON:  None. FINDINGS: Uterus Measurements: 9.8 x 5.5 x 6.5 cm. Several intramural fibroids are evident, including a 1.8 cm anterior fundal lesion and a 1.5 cm lesion in the uterine body. Endometrium Thickness: 12 mm.  No focal  abnormality visualized. Right ovary Measurements: 2.9 x 2.8 x 2.4 cm. Normal appearance/no adnexal mass. Left ovary Measurements: 2.4 x 1.0 x 1.0 cm. Normal appearance/no adnexal mass. Pulsed Doppler evaluation of both ovaries demonstrates normal low-resistance arterial and venous waveforms. Other findings No abnormal free fluid. IMPRESSION: No sonographic findings to explain the patient's history of right lower quadrant pain. Electronically Signed   By: Kennith CenterEric  Mansell M.D.   On: 08/31/2016 20:12   [place] Art/ven Flow Abd Pelv Doppler  Result Date: 08/31/2016 CLINICAL DATA:  Four day history of diffuse pelvic and right lower quadrant pain. EXAM: TRANSABDOMINAL AND TRANSVAGINAL ULTRASOUND OF PELVIS DOPPLER ULTRASOUND OF OVARIES TECHNIQUE: Both transabdominal and transvaginal ultrasound examinations of the pelvis were performed. Transabdominal technique was performed for global imaging of the pelvis including uterus, ovaries, adnexal regions, and pelvic cul-de-sac. It was necessary to proceed with endovaginal exam following the transabdominal exam to visualize the uterus and ovaries. Color and duplex Doppler ultrasound was utilized to evaluate blood flow to the ovaries. COMPARISON:  None. FINDINGS: Uterus Measurements: 9.8 x 5.5 x 6.5 cm. Several intramural fibroids are evident, including a 1.8 cm anterior fundal lesion and a 1.5 cm lesion in the uterine body. Endometrium Thickness: 12 mm.  No focal abnormality visualized. Right ovary Measurements: 2.9 x 2.8  x 2.4 cm. Normal appearance/no adnexal mass. Left ovary Measurements: 2.4 x 1.0 x 1.0 cm. Normal appearance/no adnexal mass. Pulsed Doppler evaluation of both ovaries demonstrates normal low-resistance arterial and venous waveforms. Other findings No abnormal free fluid. IMPRESSION: No sonographic findings to explain the patient's history of right lower quadrant pain. Electronically Signed   By: Kennith Center M.D.   On: 08/31/2016 20:12     Procedures Procedures (including critical care time)  DIAGNOSTIC STUDIES: Oxygen Saturation is 100% on RA, normal by my interpretation.    COORDINATION OF CARE: 6:48 PM Discussed treatment plan with pt at bedside and pt agreed to plan.  Medications Ordered in ED Medications  metroNIDAZOLE (FLAGYL) tablet 500 mg (500 mg Oral Given 08/31/16 2130)     Initial Impression / Assessment and Plan / ED Course  I have reviewed the triage vital signs and the nursing notes.  Pertinent labs & imaging results that were available during my care of the patient were reviewed by me and considered in my medical decision making (see chart for details).  Clinical Course     Patient is a 40 year old female presenting with vaginal discharge. Patient's service not as she smelling as she anticipated and not dyspneic as usual. Patient has pain in her adnexa right worse than left.  On vaginal exam she has fishy smelling odor and discharge. Does have pain on the right worse than left. Will sent for wet prep, GC chlamydia, trans vaginal ultrasound.  Ultrasound is normal. And wet prep shows bacterial vaginosis. We'll treat and have patient follow up with primary care as needed.  I personally performed the services described in this documentation, which was scribed in my presence. The recorded information has been reviewed and is accurate.     Final Clinical Impressions(s) / ED Diagnoses   Final diagnoses:  Right adnexal tenderness  BV (bacterial vaginosis)    New Prescriptions Discharge Medication List as of 08/31/2016  9:16 PM    START taking these medications   Details  metroNIDAZOLE (FLAGYL) 500 MG tablet Take 1 tablet (500 mg total) by mouth 2 (two) times daily., Starting Wed 08/31/2016, Print         Hydee Fleece Randall An, MD 08/31/16 2153

## 2016-08-31 NOTE — ED Notes (Signed)
ED Provider at bedside. 

## 2016-09-01 LAB — GC/CHLAMYDIA PROBE AMP (~~LOC~~) NOT AT ARMC
CHLAMYDIA, DNA PROBE: NEGATIVE
NEISSERIA GONORRHEA: NEGATIVE

## 2016-12-20 ENCOUNTER — Emergency Department (HOSPITAL_BASED_OUTPATIENT_CLINIC_OR_DEPARTMENT_OTHER)
Admission: EM | Admit: 2016-12-20 | Discharge: 2016-12-20 | Disposition: A | Discharge status: Home / Self Care | Payer: Medicaid Other | Attending: Emergency Medicine | Admitting: Emergency Medicine

## 2016-12-20 ENCOUNTER — Emergency Department (HOSPITAL_BASED_OUTPATIENT_CLINIC_OR_DEPARTMENT_OTHER): Payer: Medicaid Other

## 2016-12-20 ENCOUNTER — Encounter (HOSPITAL_BASED_OUTPATIENT_CLINIC_OR_DEPARTMENT_OTHER): Payer: Self-pay

## 2016-12-20 DIAGNOSIS — I10 Essential (primary) hypertension: Secondary | ICD-10-CM | POA: Insufficient documentation

## 2016-12-20 DIAGNOSIS — R110 Nausea: Secondary | ICD-10-CM | POA: Insufficient documentation

## 2016-12-20 DIAGNOSIS — R519 Headache, unspecified: Secondary | ICD-10-CM

## 2016-12-20 DIAGNOSIS — R51 Headache: Secondary | ICD-10-CM | POA: Insufficient documentation

## 2016-12-20 DIAGNOSIS — R0789 Other chest pain: Secondary | ICD-10-CM

## 2016-12-20 DIAGNOSIS — R509 Fever, unspecified: Secondary | ICD-10-CM | POA: Insufficient documentation

## 2016-12-20 DIAGNOSIS — H53149 Visual discomfort, unspecified: Secondary | ICD-10-CM | POA: Insufficient documentation

## 2016-12-20 LAB — CBC WITH DIFFERENTIAL/PLATELET
Basophils Absolute: 0 10*3/uL (ref 0.0–0.1)
Basophils Relative: 0 %
Eosinophils Absolute: 0 10*3/uL (ref 0.0–0.7)
Eosinophils Relative: 1 %
HEMATOCRIT: 41.2 % (ref 36.0–46.0)
HEMOGLOBIN: 14.1 g/dL (ref 12.0–15.0)
LYMPHS ABS: 1.6 10*3/uL (ref 0.7–4.0)
Lymphocytes Relative: 27 %
MCH: 31.3 pg (ref 26.0–34.0)
MCHC: 34.2 g/dL (ref 30.0–36.0)
MCV: 91.4 fL (ref 78.0–100.0)
MONOS PCT: 8 %
Monocytes Absolute: 0.5 10*3/uL (ref 0.1–1.0)
NEUTROS ABS: 3.8 10*3/uL (ref 1.7–7.7)
NEUTROS PCT: 64 %
Platelets: 295 10*3/uL (ref 150–400)
RBC: 4.51 MIL/uL (ref 3.87–5.11)
RDW: 12.8 % (ref 11.5–15.5)
WBC: 6 10*3/uL (ref 4.0–10.5)

## 2016-12-20 LAB — BASIC METABOLIC PANEL
Anion gap: 6 (ref 5–15)
BUN: 11 mg/dL (ref 6–20)
CO2: 29 mmol/L (ref 22–32)
CREATININE: 0.8 mg/dL (ref 0.44–1.00)
Calcium: 9.8 mg/dL (ref 8.9–10.3)
Chloride: 103 mmol/L (ref 101–111)
Glucose, Bld: 92 mg/dL (ref 65–99)
POTASSIUM: 3.6 mmol/L (ref 3.5–5.1)
SODIUM: 138 mmol/L (ref 135–145)

## 2016-12-20 LAB — TROPONIN I
Troponin I: <0.03 ng/mL (ref <0.03)
Troponin I: <0.03 ng/mL (ref <0.03)

## 2016-12-20 LAB — HCG, SERUM, QUALITATIVE: Preg, Serum: NEGATIVE

## 2016-12-20 MED ORDER — KETOROLAC TROMETHAMINE 30 MG/ML IJ SOLN
15.0000 mg | Freq: Once | INTRAMUSCULAR | Status: AC
  Administered 2016-12-20: 15 mg via INTRAVENOUS
  Filled 2016-12-20: qty 1

## 2016-12-20 MED ORDER — SODIUM CHLORIDE 0.9 % IV BOLUS (SEPSIS)
1000.0000 mL | Freq: Once | INTRAVENOUS | Status: AC
Start: 2016-12-20 — End: 2016-12-20
  Administered 2016-12-20: 1000 mL via INTRAVENOUS

## 2016-12-20 MED ORDER — DIPHENHYDRAMINE HCL 50 MG/ML IJ SOLN
12.5000 mg | Freq: Once | INTRAMUSCULAR | Status: AC
  Administered 2016-12-20: 12.5 mg via INTRAVENOUS
  Filled 2016-12-20: qty 1

## 2016-12-20 MED ORDER — PROCHLORPERAZINE EDISYLATE 5 MG/ML IJ SOLN
10.0000 mg | Freq: Once | INTRAMUSCULAR | Status: AC
  Administered 2016-12-20: 10 mg via INTRAVENOUS
  Filled 2016-12-20: qty 2

## 2016-12-20 NOTE — ED Provider Notes (Signed)
[facility]   CSN: [id number] Arrival date & time: 12/20/16  1632  By signing my name below, I, [name], attest that this documentation has been prepared under the direction and in the presence of Demetrios LollKenneth Khamil Lamica, PA-C.  Electronically Signed: [name], Scribe. 12/20/16. 5:05 PM.  History   Chief Complaint Chief Complaint  Patient presents with  . Headache  . Chest Pain    HPI Comments: Holly Barnett is a 41 y.o. female with a history of hypertension, who presents to the Emergency Department complaining of sudden-onset, waxing and waning headache that began three weeks ago. States it feels like a migraine. Denies sudden or maximal onset. Denies worst headache of her life. Patient states she feels as if a headband is placing circularized pressure on her head. Patient states her headache is constant but the pain gradually decreases at times.. Patient reports photophobia, sound sensitivity, and nausea. Patient reports taking Excedrin with no relief in pain. Patient describes her headache as throbbing with a severity of 9/10. Patient denies any migraine history, fever, vomiting or increased stress. Patient does sit at a computer for work with her neck in a flexed position. Notes upper trapezius tenderness.  Patient also complaining of sudden-onset, intermittent non-radiating left-sided chest pain that began at 7:30 this morning. Patient reports sitting at work when the chest pain began. Nonexertional. Not pleuritic in nature. Patient states her episodes last 2-3 minutes, last episode was at 2:30 PM. Patient has had exposure to the flu by co-workers. Patient reports associated subjective fever and chills. Patient describes her pain as "pressured" with a severity of 4/10. Patient denies any shortness of breath, recent surgery/hospitalizations, diaphoresis, birth control use, recent travel, lower extremity edema, cough, rhinorrhea, or sore throat. Patient denies any  cardiac history or diabetes. No family history of cardiac problems. She states that she was on blood pressure medicine 3 years ago however she was discontinued with PCP due to normal blood pressure.   The history is provided by the patient. No language interpreter was used.  Headache   This is a new problem. Episode onset: 3 weeks ago. The problem occurs constantly. Progression since onset: waxing and waning. The headache is associated with bright light and loud noise. The quality of the pain is described as throbbing. The pain is at a severity of 9/10. Radiates to: ears. Associated symptoms include a fever (subjective) and nausea. Pertinent negatives include no shortness of breath and no vomiting.  Chest Pain   This is a new problem. The current episode started 6 to 12 hours ago. Episode frequency: intermittent. The problem has not changed since onset.Pain location: Left side. The pain is at a severity of 4/10. The pain is mild. The quality of the pain is described as pressure-like. The pain does not radiate. Duration of episode(s) is 2 minutes. Associated symptoms include a fever (subjective), headaches and nausea. Pertinent negatives include no cough, no diaphoresis, no lower extremity edema, no shortness of breath, no sputum production and no vomiting. She has tried nothing for the symptoms. There are no known risk factors.  Her past medical history is significant for hypertension.    Past Medical History:  Diagnosis Date  . Hypertension     There are no active problems to display for this patient.   Past Surgical History:  Procedure Laterality Date  . CHOLECYSTECTOMY      OB History    No data available       Home Medications  Prior to Admission medications   Not on File    Family History No family history on file.  Social History Social History  Substance Use Topics  . Smoking status: Never Smoker  . Smokeless tobacco: Never Used  . Alcohol use Yes     Comment:  occ     Allergies   Patient has no known allergies.   Review of Systems Review of Systems  Constitutional: Positive for fever (subjective). Negative for diaphoresis.  HENT: Positive for ear pain. Negative for rhinorrhea and sore throat.   Eyes: Positive for photophobia.  Respiratory: Negative for cough, sputum production and shortness of breath.   Cardiovascular: Positive for chest pain.  Gastrointestinal: Positive for nausea. Negative for vomiting.  Neurological: Positive for headaches.  All other systems reviewed and are negative.    Physical Exam Updated Vital Signs BP 120/85   Pulse 72   Temp 98.2 F (36.8 C) (Oral)   Resp 18   Ht 5\' 2"  (1.575 m)   Wt 74.8 kg   LMP 12/11/2016   SpO2 99%   BMI 30.18 kg/m   Physical Exam  Constitutional: She is oriented to person, place, and time. She appears well-developed and well-nourished. No distress.  Patient sitting in the room and the dark is not appear to be any acute distress. She is nontoxic appearing.  HENT:  Head: Normocephalic and atraumatic.  Right Ear: Tympanic membrane, external ear and ear canal normal.  Left Ear: Tympanic membrane, external ear and ear canal normal.  Nose: Nose normal.  Mouth/Throat: Uvula is midline, oropharynx is clear and moist and mucous membranes are normal.  No tenderness to the temporal arteries.  Eyes: Conjunctivae and EOM are normal. Pupils are equal, round, and reactive to light.  Neck: Normal range of motion. Neck supple.  No nuchal rigidity.  Cardiovascular: Normal rate, regular rhythm, normal heart sounds and intact distal pulses.  Exam reveals no gallop and no friction rub.   No murmur heard. Pulses are 2+ bilaterally in all extremities. Extremities are warm to touch.  Pulmonary/Chest: Effort normal and breath sounds normal. No respiratory distress. She has no wheezes. She exhibits no tenderness.  Abdominal: Soft. Bowel sounds are normal. She exhibits no distension. There is no  tenderness. There is no rebound and no guarding.  Musculoskeletal: Normal range of motion. She exhibits no edema, tenderness or deformity.  No lower extremity edema. No calf tenderness. Patient with mild tenderness to palpation of the bilateral upper trapezius with tense musculature noted.  Neurological: She is alert and oriented to person, place, and time.  The patient is alert, attentive, and oriented x 3. Speech is clear. Cranial nerve II-VII grossly intact. Negative pronator drift. Sensation intact. Strength 5/5 in all extremities. Reflexes 2+ and symmetric at biceps, triceps, knees, and ankles. Rapid alternating movement and fine finger movements intact. Romberg is absent. Posture and gait normal.   Skin: Skin is warm and dry. Capillary refill takes less than 2 seconds.  Psychiatric: She has a normal mood and affect.  Nursing note and vitals reviewed.    ED Treatments / Results  DIAGNOSTIC STUDIES: Oxygen Saturation is 100% on RA, normal by my interpretation.    COORDINATION OF CARE: 5:02 PM Discussed treatment plan with pt at bedside and pt agreed to plan, which includes a migraine cocktail.   Labs (all labs ordered are listed, but only abnormal results are displayed) Labs Reviewed  BASIC METABOLIC PANEL  CBC WITH DIFFERENTIAL/PLATELET  TROPONIN I  HCG, SERUM,  QUALITATIVE  TROPONIN I    EKG  EKG Interpretation  Date/Time:  Tuesday December 20 2016 16:49:24 EST Ventricular Rate:  66 PR Interval:    QRS Duration: 90 QT Interval:  401 QTC Calculation: 421 R Axis:   37 Text Interpretation:  Sinus rhythm Abnormal R-wave progression, early transition Baseline wander in lead(s) V6 Confirmed by Lincoln Brigham [phone]) on 12/20/2016 6:18:16 PM       Radiology Dg Chest 2 View  Result Date: 12/20/2016 CLINICAL DATA:  41 y/o  F; left-sided chest pain. EXAM: CHEST  2 VIEW COMPARISON:  04/09/2016 chest radiograph FINDINGS: Stable heart size and mediastinal contours are within normal  limits. Both lungs are clear. Mild degenerative changes of the thoracic spine. IMPRESSION: No active cardiopulmonary disease. Electronically Signed   By: Mitzi Hansen M.D.   On: 12/20/2016 18:01    Procedures Procedures (including critical care time)  Medications Ordered in ED Medications  sodium chloride 0.9 % bolus 1,000 mL (0 mLs Intravenous Stopped 12/20/16 1823)  prochlorperazine (COMPAZINE) injection 10 mg (10 mg Intravenous Given 12/20/16 1820)  diphenhydrAMINE (BENADRYL) injection 12.5 mg (12.5 mg Intravenous Given 12/20/16 1818)  ketorolac (TORADOL) 30 MG/ML injection 15 mg (15 mg Intravenous Given 12/20/16 1819)     Initial Impression / Assessment and Plan / ED Course  I have reviewed the triage vital signs and the nursing notes.  Pertinent labs & imaging results that were available during my care of the patient were reviewed by me and considered in my medical decision making (see chart for details).     Patient presents to the ED with several complaints including migraine-like headache and chest pain. Patient is afebrile with no focal neuro deficits. She has no nuchal rigidity or change in vision. Patient's headache treated and improved on the ED. The patient denies maximal or sudden onset. She has no significant comorbidities. Low suspicion for [facility]. Presentation of patient's headache is non-concerning for [facility], ICH, meningitis, or temporal arteritis. History and physical seems consistent with tension like headache. Patient also complains of left-sided chest pain. Denies any chest pain or shortness breath this time. PERC negative. Low risk [place]. Low suspicion for PE. Presentation is non-concerning for PE, ACS, dissection. Chest x-ray is unremarkable. EKG shows sinus rhythm with slight abnormal R-wave progression. Heart pathway score is 3. Negative delta troponins. All other lab work was unremarkable. Chest pain is not likely of cardiac or pulmonary etiology d/t presentation, perc  negative, VSS, no tracheal deviation, no JVD or new murmur, RRR, breath sounds equal bilaterally, EKG without acute abnormalities, negative troponin, and negative CXR. Chest pain likely due to musculoskeletal pain. Pt has been advised to return to the ED is CP becomes exertional, associated with diaphoresis or nausea, radiates to left jaw/arm, worsens or becomes concerning in any way. Pt is hemodynamically stable, in NAD, & able to ambulate in the ED. Pain has been managed & has no complaints prior to dc. Pt is comfortable with above plan and is stable for discharge at this time. All questions were answered prior to disposition. Strict return precautions for f/u to the ED were discussed.     Final Clinical Impressions(s) / ED Diagnoses   Final diagnoses:  Nonintractable headache, unspecified chronicity pattern, unspecified headache type  Atypical chest pain    New Prescriptions There are no discharge medications for this patient.  I personally performed the services described in this documentation, which was scribed in my presence. The recorded information has been reviewed and is  accurate.     Rise Mu, PA-C 12/21/16 0125    Tilden Fossa, MD 12/23/16 [phone]

## 2016-12-20 NOTE — Discharge Instructions (Signed)
All of your blood work has been normal today. Please continue Motrin and Tylenol at home for pain. He'll follow up with a primary care doctor. This is likely musculoskeletal pain. Headache could be due to a migraine or tension headache. Return to the ED if he develop any worsening headaches, worsening vision changes, chest pain, shortness breath or for any reason.

## 2016-12-20 NOTE — ED Triage Notes (Signed)
C/o HA x 2 weeks-CP x today

## 2016-12-20 NOTE — ED Notes (Signed)
Patient is resting comfortably with eyes closed. 

## 2017-02-27 ENCOUNTER — Encounter (HOSPITAL_BASED_OUTPATIENT_CLINIC_OR_DEPARTMENT_OTHER): Payer: Self-pay

## 2017-02-27 ENCOUNTER — Emergency Department (HOSPITAL_BASED_OUTPATIENT_CLINIC_OR_DEPARTMENT_OTHER): Payer: Self-pay

## 2017-02-27 ENCOUNTER — Emergency Department (HOSPITAL_BASED_OUTPATIENT_CLINIC_OR_DEPARTMENT_OTHER)
Admission: EM | Admit: 2017-02-27 | Discharge: 2017-02-27 | Disposition: A | Discharge status: Home / Self Care | Payer: Self-pay | Attending: Emergency Medicine | Admitting: Emergency Medicine

## 2017-02-27 DIAGNOSIS — I10 Essential (primary) hypertension: Secondary | ICD-10-CM | POA: Insufficient documentation

## 2017-02-27 DIAGNOSIS — M79652 Pain in left thigh: Secondary | ICD-10-CM | POA: Insufficient documentation

## 2017-02-27 MED ORDER — DICLOFENAC SODIUM 75 MG PO TBEC: 75.0000 mg | DELAYED_RELEASE_TABLET | Freq: Two times a day (BID) | ORAL | 0 refills | Status: AC

## 2017-02-27 NOTE — Discharge Instructions (Signed)
Continue use of anti-inflammatories (Voltaren twice daily; do not take ibuprofen while you are on this) or Tylenol for pain. Apply ice or heat to the effected area for comfort. Follow up with sports medicine for reevaluation. Return to the ED if any concerning symptoms develop.

## 2017-02-27 NOTE — ED Notes (Signed)
Pt stated she did not want me to place knee immobilizer and she stated she was familiar with it. A 20inch knee immobilizer was measured on bad knee since she had it straight already. RN Rutherford GuysKaila informed

## 2017-02-27 NOTE — ED Provider Notes (Signed)
[facility]   CSN: [id number] Arrival date & time: 02/27/17  1504     History   Chief Complaint Chief Complaint  Patient presents with  . Leg Pain    HPI Holly Barnett is a 41 y.o. female  Who presents with chief complaint 2 months of intermittent, progressively worsening left posterior thigh pain which acutely worsened 2 weeks ago. She states pain began suddenly and denies trauma or falls. Currently pain is moderate and burning in nature, intermittently radiates to the posterior knee and mid calf. Stretching and elevating the lower extremity improves the pain. Sitting, bending, rotating makes pain worse. Excedrin, heat, ibuprofen have not been helpful. Patient also states that she developed mild sharp chest pain on deep inspiration 3-4 days ago but this was temporary and resolved within 24 hours.   Denies numbness or tingling, no cardiac hx, no hx dvt, no recent travel/surgery, no estrogen therapy, no hemoptysis, no SOB, abd pain, n/v/d  The history is provided by the patient.    Past Medical History:  Diagnosis Date  . Hypertension     There are no active problems to display for this patient.   Past Surgical History:  Procedure Laterality Date  . CHOLECYSTECTOMY      OB History    No data available       Home Medications    Prior to Admission medications   Medication Sig Start Date End Date Taking? Authorizing Provider  diclofenac (VOLTAREN) 75 MG EC tablet Take 1 tablet (75 mg total) by mouth 2 (two) times daily. 02/27/17 03/29/17  Jeanie Sewer, PA-C    Family History No family history on file.  Social History Social History  Substance Use Topics  . Smoking status: Never Smoker  . Smokeless tobacco: Never Used  . Alcohol use Yes     Comment: occ     Allergies   Patient has no known allergies.   Review of Systems Review of Systems  Constitutional: Negative for chills and fever.  Respiratory: Negative for shortness of  breath.   Cardiovascular: Negative for chest pain.  Gastrointestinal: Negative for abdominal pain, diarrhea, nausea and vomiting.  Genitourinary: Negative for dysuria and hematuria.  Musculoskeletal: Positive for myalgias. Negative for back pain.  Neurological: Negative for numbness.  All other systems reviewed and are negative.    Physical Exam Updated Vital Signs BP (!) 156/95 (BP Location: Left Arm)   Pulse (!) 57   Temp 98.5 F (36.9 C) (Oral)   Resp 16   Ht 5\' 2"  (1.575 m)   Wt 74.4 kg   LMP 02/18/2017   SpO2 99%   BMI 30.00 kg/m   Physical Exam  Constitutional: She appears well-developed and well-nourished. No distress.  HENT:  Head: Normocephalic and atraumatic.  Eyes: Conjunctivae are normal. Right eye exhibits no discharge. Left eye exhibits no discharge. No scleral icterus.  Neck: Neck supple.  Cardiovascular: Normal rate and regular rhythm.   No murmur heard. 2+ radial and DP/PT pulses bl, negative Homan's bl   Pulmonary/Chest: Effort normal and breath sounds normal. No respiratory distress. She exhibits no tenderness.  Abdominal: Soft. She exhibits no distension. There is no tenderness.  Musculoskeletal: She exhibits tenderness. She exhibits no edema or deformity.  No midline spine TTP. No paraspinal muscle spasm or tenderness. Full ROM of LSP and bilateral hips and knees. Posterior thigh pain elicited with flexion, extension, and lateral rotation of the lumbar spine as well as straight leg raise of  the right leg. Left posterior thigh tender to palpation.    Neurological: She is alert. No sensory deficit.  Fluent speech, no facial droop, sensation intact globally, antalgic gait, and patient able to heel walk and toe walk without difficulty.   Skin: Skin is warm and dry.  Psychiatric: She has a normal mood and affect.  Nursing note and vitals reviewed.    ED Treatments / Results  Labs (all labs ordered are listed, but only abnormal results are  displayed) Labs Reviewed - No data to display  EKG  EKG Interpretation None       Radiology US Venous Img Lower Unilateral Left  Result Date: 02/27/2017 CLINICAL DATA:  Left posterior thigh pain for the past 2 months. Evaluate for DVT. EXAM: LEFT LOWER EXTREMITY VENOUS DOPPLER ULTRASOUND TECHNIQUE: Gray-scale sonography with graded compression, as well as color Doppler and duplex ultrasound were performed to evaluate the lower extremity deep venous systems from the level of the common femoral vein and including the common femoral, femoral, profunda femoral, popliteal and calf veins including the posterior tibial, peroneal and gastrocnemius veins when visible. The superficial great saphenous vein was also interrogated. Spectral Doppler was utilized to evaluate flow at rest and with distal augmentation maneuvers in the common femoral, femoral and popliteal veins. COMPARISON:  None. FINDINGS: Contralateral Common Femoral Vein: Respiratory phasicity is normal and symmetric with the symptomatic side. No evidence of thrombus. Normal compressibility. Common Femoral Vein: No evidence of thrombus. Normal compressibility, respiratory phasicity and response to augmentation. Saphenofemoral Junction: No evidence of thrombus. Normal compressibility and flow on color Doppler imaging. Profunda Femoral Vein: No evidence of thrombus. Normal compressibility and flow on color Doppler imaging. Femoral Vein: No evidence of thrombus. Normal compressibility, respiratory phasicity and response to augmentation. Popliteal Vein: No evidence of thrombus. Normal compressibility, respiratory phasicity and response to augmentation. Calf Veins: No evidence of thrombus. Normal compressibility and flow on color Doppler imaging. Superficial Great Saphenous Vein: No evidence of thrombus. Normal compressibility and flow on color Doppler imaging. Venous Reflux:  None. Other Findings: Provided grayscale images of the patient's area of pain  involving the posterior aspect of the left thigh is negative for discrete solid or cystic lesion (image 33). IMPRESSION: 1. No evidence of DVT within the left lower extremity. 2. No sonographic correlate for patient's area of pain involving the posterior aspect of the left thigh. Electronically Signed   By: Simonne Come M.D.   On: 02/27/2017 16:38   Dg Femur Min 2 Views Left  Result Date: 02/27/2017 CLINICAL DATA:  Left posterior thigh pain for 2 months EXAM: LEFT FEMUR 2 VIEWS COMPARISON:  None. FINDINGS: There is no evidence of fracture or other focal bone lesions. Soft tissues are unremarkable. IMPRESSION: Negative. Electronically Signed   By: Jasmine Pang M.D.   On: 02/27/2017 17:00    Procedures Procedures (including critical care time)  Medications Ordered in ED Medications - No data to display   Initial Impression / Assessment and Plan / ED Course  I have reviewed the triage vital signs and the nursing notes.  Pertinent labs & imaging results that were available during my care of the patient were reviewed by me and considered in my medical decision making (see chart for details).     Patient with ongoing left posterior thigh pain for 2 months. No focal neurological deficits, strength intact. Left thigh posteriorly tender to palpation, no TTP of popliteal fossa or calf. X-rays negative for fracture or dislocation or evidence of  necrotizing fasciitis. Duplex negative for DVT. No further emergent workup required. Discussed RICE, given knee immobilizer and Rx for Voltaren, recommend follow-up with orthopedics for further evaluation. Discussed strict ED return precautions. Pt verbalized understanding of and agreement with plan and is safe for discharge home at this time.   Final Clinical Impressions(s) / ED Diagnoses   Final diagnoses:  Left thigh pain    New Prescriptions Discharge Medication List as of 02/27/2017  5:12 PM    START taking these medications   Details  diclofenac  (VOLTAREN) 75 MG EC tablet Take 1 tablet (75 mg total) by mouth 2 (two) times daily., Starting Mon 02/27/2017, Until Wed 03/29/2017, Print         Luevenia MaxinFawze, BlancoMina A, PA-C 02/27/17 2045    Benjiman CorePickering, Nathan, MD 02/27/17 [phone]

## 2017-02-27 NOTE — ED Triage Notes (Signed)
c/o pain to left posterior LE x 2 months-denies injury-NAD-steady gait

## 2017-03-31 ENCOUNTER — Encounter (HOSPITAL_BASED_OUTPATIENT_CLINIC_OR_DEPARTMENT_OTHER): Payer: Self-pay | Admitting: *Deleted

## 2017-03-31 ENCOUNTER — Emergency Department (HOSPITAL_BASED_OUTPATIENT_CLINIC_OR_DEPARTMENT_OTHER)
Admission: EM | Admit: 2017-03-31 | Discharge: 2017-03-31 | Disposition: A | Discharge status: Home / Self Care | Payer: Self-pay | Attending: Emergency Medicine | Admitting: Emergency Medicine

## 2017-03-31 ENCOUNTER — Emergency Department (HOSPITAL_BASED_OUTPATIENT_CLINIC_OR_DEPARTMENT_OTHER): Payer: Self-pay

## 2017-03-31 DIAGNOSIS — M5432 Sciatica, left side: Secondary | ICD-10-CM | POA: Insufficient documentation

## 2017-03-31 DIAGNOSIS — I10 Essential (primary) hypertension: Secondary | ICD-10-CM | POA: Insufficient documentation

## 2017-03-31 MED ORDER — OXYCODONE-ACETAMINOPHEN 5-325 MG PO TABS
1.0000 | ORAL_TABLET | Freq: Once | ORAL | Status: DC
  Filled 2017-03-31: qty 1

## 2017-03-31 MED ORDER — KETOROLAC TROMETHAMINE 30 MG/ML IJ SOLN
30.0000 mg | Freq: Once | INTRAMUSCULAR | Status: AC
  Administered 2017-03-31: 30 mg via INTRAMUSCULAR
  Filled 2017-03-31: qty 1

## 2017-03-31 MED ORDER — CYCLOBENZAPRINE HCL 10 MG PO TABS: 10.0000 mg | ORAL_TABLET | Freq: Every day | ORAL | 0 refills | Status: DC

## 2017-03-31 MED ORDER — HYDROCODONE-ACETAMINOPHEN 5-325 MG PO TABS: 1.0000 | ORAL_TABLET | Freq: Four times a day (QID) | ORAL | 0 refills | Status: DC | PRN

## 2017-03-31 MED ORDER — PREDNISONE 50 MG PO TABS: 50.0000 mg | ORAL_TABLET | Freq: Every day | ORAL | 0 refills | Status: DC

## 2017-03-31 MED FILL — HYDROCODON-APAP 5-325: 5-325 | 4 days supply | Qty: 15 | Fill #0

## 2017-03-31 MED FILL — CYCLOBENZAPRINE 10 MG TAB: 10 | 10 days supply | Qty: 10 | Fill #0

## 2017-03-31 MED FILL — predniSONE 50 MG TABS: 50 | 5 days supply | Qty: 5 | Fill #0

## 2017-03-31 NOTE — ED Provider Notes (Signed)
[facility]   CSN: [id number] Arrival date & time: 03/31/17  1234     History   Chief Complaint Chief Complaint  Patient presents with  . Back Pain    HPI Holly Barnett is a 41 y.o. female.  HPI Patient presents to the emergency department with lower back pain that radiates into her left thigh.  The patient states this is been ongoing for several months.  For the back pain got worse over the last week.  She states that nothing seems make the condition better since movements and sitting makes the pain worse.  Patient states that she did not take any medications prior to arrival for her symptoms.  Patient states that she has not had any chest pain, shortness of breath, nausea, vomiting, fever, dysuria, incontinence, abdominal pain or syncope.  The patient states that she has not seen anyone else about this back pain Past Medical History:  Diagnosis Date  . Hypertension     There are no active problems to display for this patient.   Past Surgical History:  Procedure Laterality Date  . CHOLECYSTECTOMY      OB History    No data available       Home Medications    Prior to Admission medications   Not on File    Family History No family history on file.  Social History Social History  Substance Use Topics  . Smoking status: Never Smoker  . Smokeless tobacco: Never Used  . Alcohol use Yes     Comment: occ     Allergies   Patient has no known allergies.   Review of Systems Review of Systems All other systems negative except as documented in the HPI. All pertinent positives and negatives as reviewed in the HPI.  Physical Exam Updated Vital Signs BP (!) 156/104 (BP Location: Right Arm)   Pulse 63   Temp 98.3 F (36.8 C) (Oral)   Resp 18   Ht 5\' 2"  (1.575 m)   Wt 74.8 kg (165 lb)   LMP 03/10/2017   SpO2 99%   BMI 30.18 kg/m   Physical Exam  Constitutional: She is oriented to person, place, and time. She appears  well-developed and well-nourished. No distress.  HENT:  Head: Normocephalic and atraumatic.  Mouth/Throat: Oropharynx is clear and moist.  Eyes: Pupils are equal, round, and reactive to light.  Neck: Normal range of motion. Neck supple.  Cardiovascular: Normal rate, regular rhythm and normal heart sounds.  Exam reveals no gallop and no friction rub.   No murmur heard. Pulmonary/Chest: Effort normal and breath sounds normal. No respiratory distress. She has no wheezes.  Abdominal: Soft. Bowel sounds are normal. She exhibits no distension. There is no tenderness.  Musculoskeletal:       Lumbar back: She exhibits tenderness and pain. She exhibits normal range of motion, no bony tenderness, no swelling and no deformity.  Neurological: She is alert and oriented to person, place, and time. She has normal strength. No sensory deficit. She exhibits normal muscle tone. Coordination and gait normal. GCS eye subscore is 4. GCS verbal subscore is 5. GCS motor subscore is 6.  Skin: Skin is warm and dry. Capillary refill takes less than 2 seconds. No rash noted. No erythema.  Psychiatric: She has a normal mood and affect. Her behavior is normal.  Nursing note and vitals reviewed.    ED Treatments / Results  Labs (all labs ordered are listed, but only abnormal results are  displayed) Labs Reviewed - No data to display  EKG  EKG Interpretation None       Radiology Dg Lumbar Spine Complete  Result Date: 03/31/2017 CLINICAL DATA:  Low back pain without known injury or radiculopathy. EXAM: LUMBAR SPINE - COMPLETE 4+ VIEW COMPARISON:  None. FINDINGS: Normal lumbar vertebral segmentation. Five non ribbed lumbar vertebrae are noted without listhesis, fracture nor bone destruction. Slight disc space narrowing at L5-S1. There is mild facet hypertrophy and sclerosis at L4-5 and L5-S1. Included sacroiliac joints appear intact without significant erosion or fusion. Joint spaces about both hips are maintained  as well. The patient is status post cholecystectomy. Two surgical clips are also seen in the left hemiabdomen. IMPRESSION: Slight disc space narrowing at L5-S1 with lumbar facet arthropathy most pronounced at L4-5 and L5-S1. Electronically Signed   By: Tollie Eth M.D.   On: 03/31/2017 16:07    Procedures Procedures (including critical care time)  Medications Ordered in ED Medications  oxyCODONE-acetaminophen (PERCOCET/ROXICET) 5-325 MG per tablet 1 tablet (1 tablet Oral Not Given 03/31/17 1620)  ketorolac (TORADOL) 30 MG/ML injection 30 mg (30 mg Intramuscular Given 03/31/17 1605)     Initial Impression / Assessment and Plan / ED Course  I have reviewed the triage vital signs and the nursing notes.  Pertinent labs & imaging results that were available during my care of the patient were reviewed by me and considered in my medical decision making (see chart for details).     Patient will be treated for sciatic type pain.  I will refer her to neurosurgery.  Infectious and lower lumbar-related issues on the plain films.  We will have her see them in follow-up.  She does not have any neurological deficits noted on exam.  She is able to walk.  Will treat with pain medications and have her follow-up  Final Clinical Impressions(s) / ED Diagnoses   Final diagnoses:  None    New Prescriptions New Prescriptions   No medications on file     Charlestine Night, Cordelia Poche 03/31/17 1629    Abelino Derrick, MD 04/01/17 [phone]

## 2017-03-31 NOTE — ED Triage Notes (Addendum)
Lower back pain with radiation into the back of her left upper leg for a week. No known injury.

## 2017-03-31 NOTE — ED Notes (Signed)
Patient reports that she was unhappy because she did not receive any relief from out treatment and felt like we did not "even check my leg and that is what hurt" - Education given to the patient about pain, and nerves and pain radiation as well as follow up care. Patient out in a wheel chair

## 2017-03-31 NOTE — Discharge Instructions (Signed)
Follow-up with the, Dr. provided.  Return here as needed.  Use ice and heat on your lower back.  The x-rays showed some lower lumbar degenerative changes

## 2018-01-22 ENCOUNTER — Encounter (HOSPITAL_BASED_OUTPATIENT_CLINIC_OR_DEPARTMENT_OTHER): Payer: Self-pay | Admitting: Emergency Medicine

## 2018-01-22 ENCOUNTER — Emergency Department (HOSPITAL_BASED_OUTPATIENT_CLINIC_OR_DEPARTMENT_OTHER)
Admission: EM | Admit: 2018-01-22 | Discharge: 2018-01-22 | Disposition: A | Discharge status: Home / Self Care | Payer: Self-pay | Attending: Emergency Medicine | Admitting: Emergency Medicine

## 2018-01-22 ENCOUNTER — Other Ambulatory Visit: Payer: Self-pay

## 2018-01-22 DIAGNOSIS — B3731 Acute candidiasis of vulva and vagina: Secondary | ICD-10-CM

## 2018-01-22 DIAGNOSIS — B373 Candidiasis of vulva and vagina: Secondary | ICD-10-CM | POA: Insufficient documentation

## 2018-01-22 DIAGNOSIS — B9689 Other specified bacterial agents as the cause of diseases classified elsewhere: Secondary | ICD-10-CM | POA: Insufficient documentation

## 2018-01-22 DIAGNOSIS — N760 Acute vaginitis: Secondary | ICD-10-CM | POA: Insufficient documentation

## 2018-01-22 LAB — URINALYSIS, COMPLETE (UACMP) WITH MICROSCOPIC
Bilirubin Urine: NEGATIVE
Glucose, UA: NEGATIVE mg/dL
Hgb urine dipstick: NEGATIVE
Ketones, ur: 15 mg/dL — AB
Leukocytes, UA: NEGATIVE
Nitrite: NEGATIVE
Protein, ur: NEGATIVE mg/dL
Specific Gravity, Urine: >1.03 — ABNORMAL HIGH (ref 1.005–1.030)
pH: 5.5 (ref 5.0–8.0)

## 2018-01-22 LAB — WET PREP, GENITAL
Sperm: NONE SEEN
TRICH WET PREP: NONE SEEN

## 2018-01-22 LAB — PREGNANCY, URINE: Preg Test, Ur: NEGATIVE

## 2018-01-22 MED ORDER — METRONIDAZOLE 500 MG PO TABS: 500.0000 mg | ORAL_TABLET | Freq: Two times a day (BID) | ORAL | 0 refills | Status: DC

## 2018-01-22 MED ORDER — FLUCONAZOLE 150 MG PO TABS: ORAL_TABLET | ORAL | 0 refills | Status: DC

## 2018-01-22 MED FILL — metroNIDAZOLE 500 MG TABS: 500 | 7 days supply | Qty: 14 | Fill #0

## 2018-01-22 MED FILL — FLUCONAZOLE 150 MG TABS: 150 | 2 days supply | Qty: 2 | Fill #0

## 2018-01-22 NOTE — Discharge Instructions (Signed)
Department today for vaginal irritation.  Your wet prep showed findings consistent with a yeast infection as well as bacterial vaginosis.  We are treating each of these things. -Flu can assist to treat the yeast infection take this once today and once again in 72 hours. -Metronidazole-some antibiotic to treat the bacterial vaginosis, take this once in the morning and once in the evening for the next 7 days.  Do not consume alcohol with taking this medication as it can be severely dangerous. Please take all of your antibiotics until finished. You may develop abdominal discomfort or diarrhea from the antibiotic.  You may help offset this with probiotics which you can buy at the store (ask your pharmacist if unable to find) or get probiotics in the form of eating yogurt. Do not eat or take the probiotics until 2 hours after your antibiotic. If you are unable to tolerate these side effects follow-up with your primary care provider or return to the emergency department.   If you begin to experience any blistering, rashes, swelling, or difficulty breathing seek medical care for evaluation of potentially more serious side effects.   Please be aware that this medication may interact with other medications you are taking, please be sure to discuss your medication list with your pharmacist.   We have sent test for gonorrhea and chlamydia as well as culture your urine to check for a UTI.  We will call you with these results.  Follow-up with your primary care doctor or the OB/GYN doctor provided in your discharge instructions in 1 week for reevaluation.  Return to the emergency department for any new or worsening symptoms or any other concerns that you may have.

## 2018-01-22 NOTE — ED Provider Notes (Signed)
[facility] EMERGENCY DEPARTMENT Provider Note   CSN: [id number] Arrival date & time: 01/22/18  1025     History   Chief Complaint Chief Complaint  Patient presents with  . vaginal irritation    HPI Holly Barnett is a 42 y.o. female with a history of hypertension who presents the emergency department with vaginal irritation for the past 3 days.  Patient states that the introitus region of her vagina has felt "irritated "and appeared somewhat red to her.  She has applied Vaseline to this area with some relief.  States that the irritation worsens with urination, but that she is not necessarily having dysuria.  No other specific alleviating or aggravating factors.  Denies fever, chills, vaginal discharge, vaginal bleeding, nausea, vomiting, urinary frequency, or abdominal pain.  LMP 03/06.Patient is not currently sexually active, most recently was 1 year prior, states she has no concern for STD.   HPI  Past Medical History:  Diagnosis Date  . Hypertension     There are no active problems to display for this patient.   Past Surgical History:  Procedure Laterality Date  . BACK SURGERY    . CHOLECYSTECTOMY       OB History   None      Home Medications    Prior to Admission medications   Medication Sig Start Date End Date Taking? Authorizing Provider  amLODipine (NORVASC) 5 MG tablet Take 5 mg by mouth daily.   Yes [provider]  cloNIDine (CATAPRES) 0.1 MG tablet Take 0.1 mg by mouth daily.   Yes [provider]  cyclobenzaprine (FLEXERIL) 10 MG tablet Take 1 tablet (10 mg total) by mouth at bedtime. 03/31/17   Lawyer, Cristal Deer, PA-C  HYDROcodone-acetaminophen (NORCO/VICODIN) 5-325 MG tablet Take 1 tablet by mouth every 6 (six) hours as needed for moderate pain. 03/31/17   Lawyer, Cristal Deer, PA-C  predniSONE (DELTASONE) 50 MG tablet Take 1 tablet (50 mg total) by mouth daily. 03/31/17   Charlestine Night, PA-C    Family History History  reviewed. No pertinent family history.  Social History Social History   Tobacco Use  . Smoking status: Never Smoker  . Smokeless tobacco: Never Used  Substance Use Topics  . Alcohol use: Yes    Comment: occ  . Drug use: No     Allergies   Patient has no known allergies.   Review of Systems Review of Systems  Constitutional: Negative for chills and fever.  Respiratory: Negative for shortness of breath.   Cardiovascular: Negative for chest pain.  Gastrointestinal: Negative for abdominal pain, blood in stool, constipation, diarrhea, nausea and vomiting.  Genitourinary: Positive for vaginal pain ("irritation"). Negative for dysuria, frequency, vaginal bleeding and vaginal discharge.  Musculoskeletal: Negative for back pain.     Physical Exam Updated Vital Signs BP (!) 131/96 (BP Location: Right Arm)   Pulse 80   Temp 98 F (36.7 C) (Oral)   Resp 18   Ht 5\' 2"  (1.575 m)   Wt 80.7 kg (178 lb)   LMP 12/20/2017   SpO2 100%   BMI 32.56 kg/m   Physical Exam  Constitutional: She appears well-developed and well-nourished. No distress.  HENT:  Head: Normocephalic and atraumatic.  Eyes: Conjunctivae are normal. Right eye exhibits no discharge. Left eye exhibits no discharge.  Cardiovascular: Normal rate and regular rhythm.  No murmur heard. Pulmonary/Chest: Breath sounds normal. No respiratory distress. She has no wheezes. She has no rales.  Abdominal: Soft. She exhibits no distension. There is  no tenderness. There is no rebound and no guarding.  Genitourinary: Pelvic exam was performed with patient supine. There is no rash or lesion on the right labia. There is no rash or lesion on the left labia. Cervix exhibits no motion tenderness and no friability. Right adnexum displays no mass, no tenderness and no fullness. Left adnexum displays no mass, no tenderness and no fullness. There is erythema (mild at the introitus) in the vagina. Vaginal discharge (minimal thin white) found.    Genitourinary Comments: RN Windell MouldingRuth present as chaperone.   Neurological: She is alert.  Clear speech.   Skin: Skin is warm and dry. No rash noted.  Psychiatric: She has a normal mood and affect. Her behavior is normal.  Nursing note and vitals reviewed.   ED Treatments / Results  Labs ( Results for orders placed or performed during the hospital encounter of 01/22/18  Wet prep, genital  Result Value Ref Range   Yeast Wet Prep HPF POC PRESENT (A) NONE SEEN   Trich, Wet Prep NONE SEEN NONE SEEN   Clue Cells Wet Prep HPF POC PRESENT (A) NONE SEEN   WBC, Wet Prep HPF POC MODERATE (A) NONE SEEN   Sperm NONE SEEN   Pregnancy, urine  Result Value Ref Range   Preg Test, Ur NEGATIVE NEGATIVE  Urinalysis, Complete w Microscopic  Result Value Ref Range   Color, Urine YELLOW YELLOW   APPearance HAZY (A) CLEAR   Specific Gravity, Urine >1.030 (H) 1.005 - 1.030   pH 5.5 5.0 - 8.0   Glucose, UA NEGATIVE NEGATIVE mg/dL   Hgb urine dipstick NEGATIVE NEGATIVE   Bilirubin Urine NEGATIVE NEGATIVE   Ketones, ur 15 (A) NEGATIVE mg/dL   Protein, ur NEGATIVE NEGATIVE mg/dL   Nitrite NEGATIVE NEGATIVE   Leukocytes, UA NEGATIVE NEGATIVE   Squamous Epithelial / LPF 0-5 (A) NONE SEEN   WBC, UA 0-5 0 - 5 WBC/hpf   RBC / HPF 0-5 0 - 5 RBC/hpf   Bacteria, UA MANY (A) NONE SEEN   Mucus PRESENT    Hyaline Casts, UA PRESENT    No results found. EKG None  Radiology No results found.  Procedures Procedures (including critical care time)  Medications Ordered in ED Medications - No data to display   Initial Impression / Assessment and Plan / ED Course  I have reviewed the triage vital signs and the nursing notes.  Pertinent labs & imaging results that were available during my care of the patient were reviewed by me and considered in my medical decision making (see chart for details).   Patient presents with complaint of vaginal irritation. Urine pregnancy test negative. UA with many bacteria-  negative for nitrites/leukocytes, will culture, do not feel treatment for UTI is needed at this time given UA in combination with patient's history and physical. Wet prep consistent with yeast infection, somewhat suspicious for bacterial vaginosis, patient would like tx for the yeast and BV. Will treat with Diflucan and Metronidazole with PCP and/or obgyn follow up. Instructed patient not to consume alcohol when taking Metronidzole. GC/chlamydia cultures pending at this time, patient declined prophylactic treatment and will wait for results. I discussed results, treatment plan, need for follow-up, and return precautions with the patient. Provided opportunity for questions, patient confirmed understanding and is in agreement with plan.   Final Clinical Impressions(s) / ED Diagnoses   Final diagnoses:  Candidiasis of vulva and vagina  Bacterial vaginosis    ED Discharge Orders  Ordered    fluconazole (DIFLUCAN) 150 MG tablet     01/22/18 1306    metroNIDAZOLE (FLAGYL) 500 MG tablet  2 times daily     01/22/18 [address] [place] R, PA-C 01/22/18 1318    Raeford Razor, MD 01/23/18 [phone]

## 2018-01-22 NOTE — ED Notes (Signed)
ED Provider at bedside. 

## 2018-01-22 NOTE — ED Triage Notes (Signed)
Vaginal "irritation" x3 days. Denies discharge.

## 2018-01-23 LAB — URINE CULTURE: Culture: <10000 — AB

## 2018-01-23 LAB — GC/CHLAMYDIA PROBE AMP (~~LOC~~) NOT AT ARMC
Chlamydia: NEGATIVE
Neisseria Gonorrhea: NEGATIVE

## 2018-06-02 ENCOUNTER — Encounter (HOSPITAL_BASED_OUTPATIENT_CLINIC_OR_DEPARTMENT_OTHER): Payer: Self-pay | Admitting: Emergency Medicine

## 2018-06-02 ENCOUNTER — Other Ambulatory Visit: Payer: Self-pay

## 2018-06-02 ENCOUNTER — Emergency Department (HOSPITAL_BASED_OUTPATIENT_CLINIC_OR_DEPARTMENT_OTHER)
Admission: EM | Admit: 2018-06-02 | Discharge: 2018-06-02 | Disposition: A | Discharge status: Home / Self Care | Payer: Self-pay | Attending: Emergency Medicine | Admitting: Emergency Medicine

## 2018-06-02 DIAGNOSIS — I10 Essential (primary) hypertension: Secondary | ICD-10-CM | POA: Insufficient documentation

## 2018-06-02 DIAGNOSIS — H1031 Unspecified acute conjunctivitis, right eye: Secondary | ICD-10-CM | POA: Insufficient documentation

## 2018-06-02 DIAGNOSIS — Z79899 Other long term (current) drug therapy: Secondary | ICD-10-CM | POA: Insufficient documentation

## 2018-06-02 MED ORDER — TETRACAINE HCL 0.5 % OP SOLN
2.0000 [drp] | Freq: Once | OPHTHALMIC | Status: AC
  Administered 2018-06-02: 2 [drp] via OPHTHALMIC
  Filled 2018-06-02: qty 4

## 2018-06-02 MED ORDER — TETRACAINE HCL 0.5 % OP SOLN
OPHTHALMIC | Status: AC
  Administered 2018-06-02: 2 [drp] via OPHTHALMIC
  Filled 2018-06-02: qty 4

## 2018-06-02 MED ORDER — POLYMYXIN B-TRIMETHOPRIM 10000-0.1 UNIT/ML-% OP SOLN: 1.0000 [drp] | OPHTHALMIC | 0 refills | Status: AC

## 2018-06-02 MED ORDER — FLUORESCEIN SODIUM 1 MG OP STRP
1.0000 | ORAL_STRIP | Freq: Once | OPHTHALMIC | Status: AC
  Administered 2018-06-02: 1 via OPHTHALMIC
  Filled 2018-06-02: qty 1

## 2018-06-02 NOTE — ED Notes (Signed)
Pt states eye felt irritated on Thursday night. On Friday, she put visine in her eye to try to relieve and she feels it made it more irritated. States it feels like she has sand in her eye. She is experiencing blurry vision with eye watering. Itching and irritation.

## 2018-06-02 NOTE — ED Notes (Signed)
Patient denies pain and is resting comfortably. Is experiencing irritation/itching

## 2018-06-02 NOTE — ED Provider Notes (Signed)
[facility] EMERGENCY DEPARTMENT Provider Note   CSN: [id number] Arrival date & time: 06/02/18  [id number]     History   Chief Complaint Chief Complaint  Patient presents with  . Eye Problem    HPI Holly Barnett is a 42 y.o. female.  HPI Patient has foreign body sensation, redness, clear tearing and blurred vision to her right eye for the past 2 days.  Does not remember scratching the eye.  Does not wear contact lenses.  Denies nasal sinus congestion.  Mild photophobia. Past Medical History:  Diagnosis Date  . Hypertension     There are no active problems to display for this patient.   Past Surgical History:  Procedure Laterality Date  . BACK SURGERY    . CHOLECYSTECTOMY       OB History   None      Home Medications    Prior to Admission medications   Medication Sig Start Date End Date Taking? Authorizing Provider  amLODipine (NORVASC) 5 MG tablet Take 5 mg by mouth daily.    [provider]  cloNIDine (CATAPRES) 0.1 MG tablet Take 0.1 mg by mouth daily.    [provider]  cyclobenzaprine (FLEXERIL) 10 MG tablet Take 1 tablet (10 mg total) by mouth at bedtime. 03/31/17   Lawyer, Cristal Deerhristopher, PA-C  fluconazole (DIFLUCAN) 150 MG tablet Take 1 tablet today, take another tablet in 72 hours. 01/22/18   Petrucelli, Pleas KochSamantha R, PA-C  HYDROcodone-acetaminophen (NORCO/VICODIN) 5-325 MG tablet Take 1 tablet by mouth every 6 (six) hours as needed for moderate pain. 03/31/17   Lawyer, Cristal Deerhristopher, PA-C  metroNIDAZOLE (FLAGYL) 500 MG tablet Take 1 tablet (500 mg total) by mouth 2 (two) times daily. 01/22/18   Petrucelli, Samantha R, PA-C  predniSONE (DELTASONE) 50 MG tablet Take 1 tablet (50 mg total) by mouth daily. 03/31/17   Lawyer, Cristal Deerhristopher, PA-C  trimethoprim-polymyxin b (POLYTRIM) ophthalmic solution Place 1 drop into the right eye every 4 (four) hours for 7 days. 06/02/18 06/09/18  Loren RacerYelverton, Saliou Barnier, MD    Family History No family history on  file.  Social History Social History   Tobacco Use  . Smoking status: Never Smoker  . Smokeless tobacco: Never Used  Substance Use Topics  . Alcohol use: Yes    Comment: occ  . Drug use: No     Allergies   Patient has no known allergies.   Review of Systems Review of Systems  Constitutional: Negative for chills and fever.  HENT: Negative for congestion and sinus pressure.   Eyes: Positive for photophobia, pain, discharge, redness, itching and visual disturbance.  Skin: Negative for rash and wound.  Neurological: Negative for headaches.  All other systems reviewed and are negative.    Physical Exam Updated Vital Signs BP (!) 162/115 (BP Location: Left Arm)   Pulse (!) 55   Temp 97.8 F (36.6 C) (Oral)   Resp 16   Ht 5\' 2"  (1.575 m)   Wt 78 kg   LMP 05/23/2018   SpO2 99%   BMI 31.46 kg/m   Physical Exam  Constitutional: She is oriented to person, place, and time. She appears well-developed and well-nourished. No distress.  HENT:  Head: Normocephalic and atraumatic.  Mouth/Throat: Oropharynx is clear and moist.  Eyes: Pupils are equal, round, and reactive to light. EOM and lids are normal. Lids are everted and swept, no foreign bodies found. Right eye exhibits discharge. No foreign body present in the right eye. Left eye exhibits no discharge. Right  conjunctiva is injected. Right conjunctiva has no hemorrhage. Left conjunctiva is injected. Left conjunctiva has no hemorrhage.  No direct or consensual photophobia.  No foreign bodies visualized.  Neck: Normal range of motion. Neck supple.  Cardiovascular: Normal rate and regular rhythm.  Pulmonary/Chest: Effort normal and breath sounds normal.  Abdominal: Soft. Bowel sounds are normal. There is no tenderness. There is no rebound and no guarding.  Musculoskeletal: Normal range of motion. She exhibits no edema or tenderness.  Neurological: She is alert and oriented to person, place, and time.  Skin: Skin is warm and  dry. No rash noted. She is not diaphoretic. No erythema.  Psychiatric: She has a normal mood and affect. Her behavior is normal.  Nursing note and vitals reviewed.    ED Treatments / Results  Labs (all labs ordered are listed, but only abnormal results are displayed) Labs Reviewed - No data to display  EKG None  Radiology No results found.  Procedures Procedures (including critical care time)  Medications Ordered in ED Medications  tetracaine (PONTOCAINE) 0.5 % ophthalmic solution 2 drop (2 drops Right Eye Given 06/02/18 0805)  fluorescein ophthalmic strip 1 strip (1 strip Right Eye Given 06/02/18 0802)     Initial Impression / Assessment and Plan / ED Course  I have reviewed the triage vital signs and the nursing notes.  Pertinent labs & imaging results that were available during my care of the patient were reviewed by me and considered in my medical decision making (see chart for details).    No fluorescein uptake.  No evidence of foreign bodies.  Suspect conjunctivitis, likely viral.  Advised to follow-up with ophthalmology.  Patient also encouraged to follow-up with her primary physician regarding blood pressure control.   Final Clinical Impressions(s) / ED Diagnoses   Final diagnoses:  Acute conjunctivitis of right eye, unspecified acute conjunctivitis type    ED Discharge Orders         Ordered    trimethoprim-polymyxin b (POLYTRIM) ophthalmic solution  Every 4 hours     06/02/18 0824           Loren RacerYelverton, Peter Keyworth, MD 06/05/18 [phone]

## 2018-06-02 NOTE — ED Triage Notes (Signed)
R eye pain x 2 days.

## 2020-02-12 ENCOUNTER — Other Ambulatory Visit: Payer: Self-pay

## 2020-02-12 ENCOUNTER — Encounter: Payer: Self-pay | Admitting: Medical

## 2020-02-12 ENCOUNTER — Ambulatory Visit: Payer: PRIVATE HEALTH INSURANCE | Admitting: Medical

## 2020-02-12 VITALS — BP 170/98 | HR 74 | Temp 97.0°F | Resp 18 | Ht 62.0 in | Wt 175.6 lb

## 2020-02-12 DIAGNOSIS — Z124 Encounter for screening for malignant neoplasm of cervix: Secondary | ICD-10-CM | POA: Diagnosis not present

## 2020-02-12 DIAGNOSIS — Z1211 Encounter for screening for malignant neoplasm of colon: Secondary | ICD-10-CM

## 2020-02-12 DIAGNOSIS — I10 Essential (primary) hypertension: Secondary | ICD-10-CM

## 2020-02-12 LAB — COMPREHENSIVE METABOLIC PANEL
ALT: 15 U/L (ref 0–35)
AST: 19 U/L (ref 0–37)
Albumin: 4 g/dL (ref 3.5–5.2)
Alkaline Phosphatase: 80 U/L (ref 39–117)
BUN: 9 mg/dL (ref 6–23)
CO2: 31 mEq/L (ref 19–32)
Calcium: 9 mg/dL (ref 8.4–10.5)
Chloride: 104 mEq/L (ref 96–112)
Creatinine, Ser: 0.7 mg/dL (ref 0.40–1.20)
GFR: 109.18 mL/min (ref >60.00)
Glucose, Bld: 122 mg/dL — ABNORMAL HIGH (ref 70–99)
Potassium: 4.1 mEq/L (ref 3.5–5.1)
Sodium: 139 mEq/L (ref 135–145)
Total Bilirubin: 0.3 mg/dL (ref 0.2–1.2)
Total Protein: 6.5 g/dL (ref 6.0–8.3)

## 2020-02-12 LAB — LIPID PANEL
Cholesterol: 166 mg/dL (ref 0–200)
HDL: 46.4 mg/dL (ref >39.00)
LDL Cholesterol: 96 mg/dL (ref 0–99)
NonHDL: 119.63
Total CHOL/HDL Ratio: 4
Triglycerides: 118 mg/dL (ref 0.0–149.0)
VLDL: 23.6 mg/dL (ref 0.0–40.0)

## 2020-02-12 MED ORDER — AMLODIPINE BESYLATE 10 MG PO TABS
10.0000 mg | ORAL_TABLET | Freq: Every day | ORAL | 3 refills | Status: DC
Start: 2020-02-12 — End: 2020-06-16

## 2020-02-12 MED ORDER — CHLORTHALIDONE 25 MG PO TABS
25.0000 mg | ORAL_TABLET | Freq: Every day | ORAL | 3 refills | Status: DC
Start: 2020-02-12 — End: 2020-06-16

## 2020-02-12 NOTE — Patient Instructions (Addendum)
You have high bp level today but have not been on any medication for months. Will rx amlodipine 10 mg daily dose and chorthalidone 25 mg daily. Rx advisement given. Get cmp and lipid panel today. Make sure eating potassium rich diet while on diuretic. If any muscle cramps let us know.  Did place GI and Gyn referral today. Screening colonoscopy and pap smear.  Please get bp cuff over the counter to check bp on occasion as discussed.  Follow up 2 weeks or as needed.  With bp elevation if you get cardiac or neurologic signs/symptoms then recommend ED evaluation.

## 2020-02-12 NOTE — Progress Notes (Signed)
Subjective:    Patient ID: Holly Barnett, female    DOB: September 06, 1976, 44 y.o.   MRN: [id number]  HPI  Pt in for first time.  Pt [facility] medical provider [facility]. She has not been there since October.  Pt works at [employer] as [employer], does not exercise regularly. Pt admits does not eat healthy, non smoker, occasional alcohol use and has 3 children. 46 yo, 5 o and 46 yo.  Pt has htn. She has not been on medications since October. BP high today but no cardiac or neurologic signs/symptoms. Pt states was on norvasc and clonidine. She states would use clonidine only if amlodipine did not not work. She states alone amlodipone did not help. At another clinic she had angioedema to lisinopril.  Pt has never been on diuretic.   lmp-hx of uterine ablation nov 2018.       Review of Systems  Constitutional: Negative for chills, fatigue and fever.  Respiratory: Negative for cough, chest tightness, shortness of breath and wheezing.   Cardiovascular: Negative for chest pain and palpitations.  Gastrointestinal: Negative for abdominal pain, nausea and vomiting.  Genitourinary: Negative for dysuria.  Musculoskeletal: Negative for back pain.  Skin: Negative for rash.  Neurological: Negative for dizziness, seizures, speech difficulty, weakness, light-headedness and headaches.  Hematological: Negative for adenopathy. Does not bruise/bleed easily.  Psychiatric/Behavioral: Negative for behavioral problems, confusion, sleep disturbance and suicidal ideas. The patient is not nervous/anxious.     Past Medical History:  Diagnosis Date  . Hypertension      Social History   Socioeconomic History  . Marital status: Divorced    Spouse name: Not on file  . Number of children: Not on file  . Years of education: Not on file  . Highest education level: Not on file  Occupational History  . Occupation: cna  Tobacco Use  . Smoking status: Never Smoker  . Smokeless tobacco:  Never Used  Substance and Sexual Activity  . Alcohol use: Yes    Comment: occ  . Drug use: No  . Sexual activity: Not on file  Other Topics Concern  . Not on file  Social History Narrative  . Not on file   Social Determinants of Health   Financial Resource Strain:   . Difficulty of Paying Living Expenses:   Food Insecurity:   . Worried About Programme researcher, broadcasting/film/video in the Last Year:   . Barista in the Last Year:   Transportation Needs:   . Freight forwarder (Medical):   Marland Kitchen Lack of Transportation (Non-Medical):   Physical Activity:   . Days of Exercise per Week:   . Minutes of Exercise per Session:   Stress:   . Feeling of Stress :   Social Connections:   . Frequency of Communication with Friends and Family:   . Frequency of Social Gatherings with Friends and Family:   . Attends Religious Services:   . Active Member of Clubs or Organizations:   . Attends Banker Meetings:   Marland Kitchen Marital Status:   Intimate Partner Violence:   . Fear of Current or Ex-Partner:   . Emotionally Abused:   Marland Kitchen Physically Abused:   . Sexually Abused:     Past Surgical History:  Procedure Laterality Date  . BACK SURGERY    . CHOLECYSTECTOMY      History reviewed. No pertinent family history.  No Known Allergies  Current Outpatient Medications on File Prior  to Visit  Medication Sig Dispense Refill  . amLODipine (NORVASC) 5 MG tablet Take 5 mg by mouth daily.    . cloNIDine (CATAPRES) 0.1 MG tablet Take 0.1 mg by mouth daily.    . cyclobenzaprine (FLEXERIL) 10 MG tablet Take 1 tablet (10 mg total) by mouth at bedtime. (Patient not taking: Reported on 02/12/2020) 10 tablet 0  . fluconazole (DIFLUCAN) 150 MG tablet Take 1 tablet today, take another tablet in 72 hours. (Patient not taking: Reported on 02/12/2020) 2 tablet 0  . HYDROcodone-acetaminophen (NORCO/VICODIN) 5-325 MG tablet Take 1 tablet by mouth every 6 (six) hours as needed for moderate pain. (Patient not taking:  Reported on 02/12/2020) 15 tablet 0  . metroNIDAZOLE (FLAGYL) 500 MG tablet Take 1 tablet (500 mg total) by mouth 2 (two) times daily. (Patient not taking: Reported on 02/12/2020) 14 tablet 0  . predniSONE (DELTASONE) 50 MG tablet Take 1 tablet (50 mg total) by mouth daily. (Patient not taking: Reported on 02/12/2020) 5 tablet 0   No current facility-administered medications on file prior to visit.    BP (!) 169/108 (BP Location: Left Arm, Patient Position: Sitting, Cuff Size: Small) Comment: 178/91  Pulse 74   Temp (!) 97 F (36.1 C) (Temporal)   Resp 18   Ht 5\' 2"  (1.575 m)   Wt 175 lb 9.6 oz (79.7 kg)   SpO2 100%   BMI 32.12 kg/m       Objective:   Physical Exam  General Mental Status- Alert. General Appearance- Not in acute distress.   Skin General: Color- Normal Color. Moisture- Normal Moisture.  Neck Carotid Arteries- Normal color. Moisture- Normal Moisture. No carotid bruits. No JVD.  Chest and Lung Exam Auscultation: Breath Sounds:-Normal.  Cardiovascular Auscultation:Rythm- Regular. Murmurs & Other Heart Sounds:Auscultation of the heart reveals- No Murmurs.  Abdomen Inspection:-Inspeection Normal. Palpation/Percussion:Note:No mass. Palpation and Percussion of the abdomen reveal- Non Tender, Non Distended + BS, no rebound or guarding.   Neurologic Cranial Nerve exam:- CN III-XII intact(No nystagmus), symmetric smile. Strength:- 5/5 equal and symmetric strength both upper and lower extremities.      Assessment & Plan:  You have high bp level today but have not been on any medication for months. Will rx amlodipine 10 mg daily dose and chorthalidone 25 mg daily. Rx advisement given. Get cmp and lipid panel today. Make sure eating potassium rich diet while on diuretic. If any muscle cramps let us know.  Did place GI and Gyn referral today. Screening colonoscopy and papsmear.  Please get bp cuff over the counter to check bp on occasion as discussed.  Follow  up 2 weeks or as needed.  With bp elevation if you get cardiac or neurologic signs/symptoms then recommend ED evaluation.  You have high bp level today but have not been on any medication for months. Will rx amlodipine 10 mg daily dose and chorthalidone 25 mg daily. Rx advisement given. Get cmp and lipid panel today. Make sure eating potassium rich diet while on diuretic. If any muscle cramps let us know.  Did place GI and Gyn referral today. Screening colonoscopy and papsmear.  Please get bp cuff over the counter to check bp on occasion as discussed.  Follow up 2 weeks or as needed.  With bp elevation if you get cardiac or neurologic signs/symptoms then recommend ED evaluation.  Time spent with new patient today was 35 minutes which  discussing htn, answering question, work up treatment and documentation.

## 2020-02-26 ENCOUNTER — Ambulatory Visit: Payer: PRIVATE HEALTH INSURANCE | Admitting: Medical

## 2020-02-26 DIAGNOSIS — Z0289 Encounter for other administrative examinations: Secondary | ICD-10-CM

## 2020-04-23 ENCOUNTER — Encounter: Payer: Self-pay | Admitting: Medical

## 2020-06-16 ENCOUNTER — Encounter: Payer: Self-pay | Admitting: Medical

## 2020-06-16 ENCOUNTER — Ambulatory Visit (INDEPENDENT_AMBULATORY_CARE_PROVIDER_SITE_OTHER): Payer: PRIVATE HEALTH INSURANCE | Admitting: Medical

## 2020-06-16 ENCOUNTER — Other Ambulatory Visit: Payer: Self-pay

## 2020-06-16 VITALS — BP 135/80 | HR 80 | Resp 18 | Ht 62.0 in | Wt 179.6 lb

## 2020-06-16 DIAGNOSIS — R739 Hyperglycemia, unspecified: Secondary | ICD-10-CM | POA: Diagnosis not present

## 2020-06-16 MED ORDER — CHLORTHALIDONE 25 MG PO TABS: 25.0000 mg | ORAL_TABLET | Freq: Every day | ORAL | 3 refills | Status: DC

## 2020-06-16 MED ORDER — AMLODIPINE BESYLATE 10 MG PO TABS: 10.0000 mg | ORAL_TABLET | Freq: Every day | ORAL | 3 refills | Status: DC

## 2020-06-16 NOTE — Patient Instructions (Signed)
Your bp is much better controlled compared to 3 months ago. Continue amlodipine and chlorthalidone.  Your cholesterol levels were controlled. Advise low cholesterol diet.  For elevated sugar put in future cmp and a1c. Get scheduled for those test in next 2 week approximate.  Follow up in 3-6 months. 6 months if sugar not in diabetic range. If sugar is high then will recommend 3 months.

## 2020-06-16 NOTE — Progress Notes (Signed)
° °  Subjective:    Patient ID: Holly Barnett, female    DOB: 1976-08-30, 44 y.o.   MRN: [id number]  HPI  Pt in today with improved bp. Last time 170/98. I placed her on chlorthalidione and amlodipine. No adverse side effects. No cardiac or neurologic signs or symptoms. Not getting any regular exercise. Sometimes try to eat low salt diet.  Pt last cmp showed mild sugar elevation. Pt admits not eating low sugar diet.  Hx of uterine ablation in past.  Review of Systems  Constitutional: Negative for chills, fatigue and fever.  Respiratory: Negative for chest tightness, shortness of breath and wheezing.   Cardiovascular: Negative for chest pain and palpitations.  Gastrointestinal: Negative for abdominal pain.  Genitourinary: Negative for difficulty urinating and dysuria.  Musculoskeletal: Negative for back pain.  Skin: Negative for rash.  Neurological: Negative for dizziness.  Hematological: Negative for adenopathy. Does not bruise/bleed easily.  Psychiatric/Behavioral: Negative for behavioral problems and decreased concentration.       Objective:   Physical Exam  General Mental Status- Alert. General Appearance- Not in acute distress.   Skin General: Color- Normal Color. Moisture- Normal Moisture.  Neck Carotid Arteries- Normal color. Moisture- Normal Moisture. No carotid bruits. No JVD.  Chest and Lung Exam Auscultation: Breath Sounds:-Normal.  Cardiovascular Auscultation:Rythm- Regular. Murmurs & Other Heart Sounds:Auscultation of the heart reveals- No Murmurs.  Abdomen Inspection:-Inspeection Normal. Palpation/Percussion:Note:No mass. Palpation and Percussion of the abdomen reveal- Non Tender, Non Distended + BS, no rebound or guarding.   Neurologic Cranial Nerve exam:- CN III-XII intact(No nystagmus), symmetric smile. Strength:- 5/5 equal and symmetric strength both upper and lower extremities.      Assessment & Plan:  Your bp is much better controlled compared  to 3 months ago. Continue amlodipine and chlorthalidone.  Your cholesterol levels were controlled. Advise low cholesterol diet.  For elevated sugar put in future cmp and a1c. Get scheduled for those test in next 2 week approximate.  Follow up in 3-6 months. 6 months if sugar not in diabetic range. If sugar is high then will recommend 3 months.  Esperanza Richters, PA-C   Time spent with patient today was 20  minutes which consisted of chart rediew, discussing diagnosis, work up treatment and documentation.

## 2020-07-08 ENCOUNTER — Ambulatory Visit: Payer: PRIVATE HEALTH INSURANCE | Admitting: Family

## 2020-07-08 DIAGNOSIS — Z0289 Encounter for other administrative examinations: Secondary | ICD-10-CM

## 2020-09-09 NOTE — Addendum Note (Signed)
Addended by: Mervin Kung A on: 09/09/2020 03:28 PM   Modules accepted: Orders

## 2020-09-15 ENCOUNTER — Other Ambulatory Visit (INDEPENDENT_AMBULATORY_CARE_PROVIDER_SITE_OTHER): Payer: PRIVATE HEALTH INSURANCE

## 2020-09-15 ENCOUNTER — Other Ambulatory Visit: Payer: Self-pay

## 2020-09-15 ENCOUNTER — Telehealth: Payer: Self-pay | Admitting: Medical

## 2020-09-15 DIAGNOSIS — R739 Hyperglycemia, unspecified: Secondary | ICD-10-CM

## 2020-09-15 LAB — COMPREHENSIVE METABOLIC PANEL
ALT: 16 U/L (ref 0–35)
AST: 15 U/L (ref 0–37)
Albumin: 4.2 g/dL (ref 3.5–5.2)
Alkaline Phosphatase: 88 U/L (ref 39–117)
BUN: 8 mg/dL (ref 6–23)
CO2: 32 mEq/L (ref 19–32)
Calcium: 9.5 mg/dL (ref 8.4–10.5)
Chloride: 104 mEq/L (ref 96–112)
Creatinine, Ser: 0.67 mg/dL (ref 0.40–1.20)
GFR: 104.96 mL/min (ref >60.00)
Glucose, Bld: 128 mg/dL — ABNORMAL HIGH (ref 70–99)
Potassium: 4 mEq/L (ref 3.5–5.1)
Sodium: 142 mEq/L (ref 135–145)
Total Bilirubin: 0.6 mg/dL (ref 0.2–1.2)
Total Protein: 6.9 g/dL (ref 6.0–8.3)

## 2020-09-15 LAB — HEMOGLOBIN A1C: Hgb A1c MFr Bld: 6.6 % — ABNORMAL HIGH (ref 4.6–6.5)

## 2020-09-15 MED ORDER — METFORMIN HCL 500 MG PO TABS: 500.0000 mg | ORAL_TABLET | Freq: Two times a day (BID) | ORAL | 3 refills | Status: DC

## 2020-09-15 NOTE — Telephone Encounter (Signed)
Rx metformin sent to pt pharmacy. 

## 2020-12-16 ENCOUNTER — Ambulatory Visit: Payer: PRIVATE HEALTH INSURANCE | Admitting: Medical

## 2020-12-16 DIAGNOSIS — Z0289 Encounter for other administrative examinations: Secondary | ICD-10-CM

## 2021-07-01 ENCOUNTER — Other Ambulatory Visit: Payer: Self-pay | Admitting: Medical

## 2021-08-31 ENCOUNTER — Ambulatory Visit: Payer: Managed Care, Other (non HMO) | Admitting: Medical

## 2021-09-29 ENCOUNTER — Other Ambulatory Visit (HOSPITAL_COMMUNITY)
Admission: RE | Admit: 2021-09-29 | Discharge: 2021-09-29 | Disposition: A | Discharge status: Home / Self Care | Payer: Managed Care, Other (non HMO) | Source: Ambulatory Visit | Attending: Medical | Admitting: Medical

## 2021-09-29 ENCOUNTER — Ambulatory Visit (INDEPENDENT_AMBULATORY_CARE_PROVIDER_SITE_OTHER): Payer: Managed Care, Other (non HMO) | Admitting: Medical

## 2021-09-29 VITALS — BP 140/88 | HR 72 | Temp 98.2°F | Resp 18 | Ht 62.0 in | Wt 175.0 lb

## 2021-09-29 DIAGNOSIS — Z113 Encounter for screening for infections with a predominantly sexual mode of transmission: Secondary | ICD-10-CM

## 2021-09-29 DIAGNOSIS — Z0000 Encounter for general adult medical examination without abnormal findings: Secondary | ICD-10-CM

## 2021-09-29 DIAGNOSIS — Z1211 Encounter for screening for malignant neoplasm of colon: Secondary | ICD-10-CM

## 2021-09-29 DIAGNOSIS — E119 Type 2 diabetes mellitus without complications: Secondary | ICD-10-CM | POA: Diagnosis not present

## 2021-09-29 DIAGNOSIS — Z124 Encounter for screening for malignant neoplasm of cervix: Secondary | ICD-10-CM | POA: Diagnosis not present

## 2021-09-29 DIAGNOSIS — Z1231 Encounter for screening mammogram for malignant neoplasm of breast: Secondary | ICD-10-CM

## 2021-09-29 DIAGNOSIS — I10 Essential (primary) hypertension: Secondary | ICD-10-CM | POA: Diagnosis not present

## 2021-09-29 LAB — COMPREHENSIVE METABOLIC PANEL
ALT: 14 U/L (ref 0–35)
AST: 12 U/L (ref 0–37)
Albumin: 4.2 g/dL (ref 3.5–5.2)
Alkaline Phosphatase: 73 U/L (ref 39–117)
BUN: 9 mg/dL (ref 6–23)
CO2: 29 mEq/L (ref 19–32)
Calcium: 9.4 mg/dL (ref 8.4–10.5)
Chloride: 105 mEq/L (ref 96–112)
Creatinine, Ser: 0.69 mg/dL (ref 0.40–1.20)
GFR: 103.46 mL/min (ref >60.00)
Glucose, Bld: 129 mg/dL — ABNORMAL HIGH (ref 70–99)
Potassium: 3.8 mEq/L (ref 3.5–5.1)
Sodium: 140 mEq/L (ref 135–145)
Total Bilirubin: 0.4 mg/dL (ref 0.2–1.2)
Total Protein: 6.9 g/dL (ref 6.0–8.3)

## 2021-09-29 LAB — CBC WITH DIFFERENTIAL/PLATELET
Basophils Absolute: 0 10*3/uL (ref 0.0–0.1)
Basophils Relative: 0.4 % (ref 0.0–3.0)
Eosinophils Absolute: 0 10*3/uL (ref 0.0–0.7)
Eosinophils Relative: 0.8 % (ref 0.0–5.0)
HCT: 40.1 % (ref 36.0–46.0)
Hemoglobin: 13.2 g/dL (ref 12.0–15.0)
Lymphocytes Relative: 32.3 % (ref 12.0–46.0)
Lymphs Abs: 1.5 10*3/uL (ref 0.7–4.0)
MCHC: 33 g/dL (ref 30.0–36.0)
MCV: 94.4 fl (ref 78.0–100.0)
Monocytes Absolute: 0.4 10*3/uL (ref 0.1–1.0)
Monocytes Relative: 8.6 % (ref 3.0–12.0)
Neutro Abs: 2.7 10*3/uL (ref 1.4–7.7)
Neutrophils Relative %: 57.9 % (ref 43.0–77.0)
Platelets: 222 10*3/uL (ref 150.0–400.0)
RBC: 4.25 Mil/uL (ref 3.87–5.11)
RDW: 14 % (ref 11.5–15.5)
WBC: 4.7 10*3/uL (ref 4.0–10.5)

## 2021-09-29 LAB — LIPID PANEL
Cholesterol: 181 mg/dL (ref 0–200)
HDL: 50 mg/dL (ref >39.00)
LDL Cholesterol: 116 mg/dL — ABNORMAL HIGH (ref 0–99)
NonHDL: 130.59
Total CHOL/HDL Ratio: 4
Triglycerides: 72 mg/dL (ref 0.0–149.0)
VLDL: 14.4 mg/dL (ref 0.0–40.0)

## 2021-09-29 LAB — HEMOGLOBIN A1C: Hgb A1c MFr Bld: 6.5 % (ref 4.6–6.5)

## 2021-09-29 MED ORDER — CHLORTHALIDONE 25 MG PO TABS: 25.0000 mg | ORAL_TABLET | Freq: Every day | ORAL | 3 refills | Status: AC

## 2021-09-29 MED ORDER — AMLODIPINE BESYLATE 10 MG PO TABS: 10.0000 mg | ORAL_TABLET | Freq: Every day | ORAL | 3 refills | Status: AC

## 2021-09-29 NOTE — Progress Notes (Signed)
Subjective:    Patient ID: Holly Barnett, female    DOB: 1976/09/28, 45 y.o.   MRN: [id number]  HPI  Pt in for wellness exam. More than one year since last visit. Will combine wellness with follow up for routine medical conditions.   Pt works at [employer] as [employer], does not exercise regularly. Pt admits does not eat healthy, non smoker, occasional alcohol use and has 3 children. 38 yo, 73 o and 73 yo.  lmp-hx of uterine ablation nov 2018.  Bp well controlled today.  Placing referral for colonoscopy. Also referral for pap. I had already done that in past April 2021.  Pt expressed to MA wanted screeining std. No blister. No vaginal dc. No symptoms presently.  Pt diabetic. Last a1c was 6.6. I had prescribed metformin. She was unaware of rx?  Htn- pt on amlodipine. Took one tab today. I had given her one year supply. She has been using sporadically since august when ran out of med and has not return until know. Pt not on chlorthalidone presently. Has not taken this since august. Pt got bp reading about 140/88 yesterday.    Pt needs screening mammogram. She can't remember ever getting one.   Review of Systems  Constitutional:  Negative for chills, fatigue and fever.  HENT:  Negative for dental problem.   Respiratory:  Negative for cough, choking, chest tightness, shortness of breath and wheezing.   Cardiovascular:  Negative for chest pain and palpitations.  Gastrointestinal:  Negative for abdominal pain, blood in stool and constipation.  Endocrine: Negative for polydipsia and polyphagia.  Musculoskeletal:  Negative for back pain.  Neurological:  Negative for dizziness, tremors, speech difficulty, weakness and headaches.  Hematological:  Negative for adenopathy. Does not bruise/bleed easily.  Psychiatric/Behavioral:  Negative for behavioral problems and dysphoric mood.     Past Medical History:  Diagnosis Date   Hypertension      Social History   Socioeconomic History    Marital status: Divorced    Spouse name: Not on file   Number of children: Not on file   Years of education: Not on file   Highest education level: Not on file  Occupational History   Occupation: cna  Tobacco Use   Smoking status: Never   Smokeless tobacco: Never  Substance and Sexual Activity   Alcohol use: Yes    Comment: occ   Drug use: No   Sexual activity: Not on file  Other Topics Concern   Not on file  Social History Narrative   Not on file   Social Determinants of Health   Financial Resource Strain: Not on file  Food Insecurity: Not on file  Transportation Needs: Not on file  Physical Activity: Not on file  Stress: Not on file  Social Connections: Not on file  Intimate Partner Violence: Not on file    Past Surgical History:  Procedure Laterality Date   BACK SURGERY     CHOLECYSTECTOMY      No family history on file.  No Known Allergies  Current Outpatient Medications on File Prior to Visit  Medication Sig Dispense Refill   amLODipine (NORVASC) 10 MG tablet Take 1 tablet (10 mg total) by mouth daily. 90 tablet 3   chlorthalidone (HYGROTON) 25 MG tablet Take 1 tablet (25 mg total) by mouth daily. 90 tablet 3   cyclobenzaprine (FLEXERIL) 10 MG tablet Take 1 tablet (10 mg total) by mouth at bedtime. (Patient not taking: Reported on 02/12/2020) 10 tablet 0  fluconazole (DIFLUCAN) 150 MG tablet Take 1 tablet today, take another tablet in 72 hours. (Patient not taking: Reported on 02/12/2020) 2 tablet 0   HYDROcodone-acetaminophen (NORCO/VICODIN) 5-325 MG tablet Take 1 tablet by mouth every 6 (six) hours as needed for moderate pain. (Patient not taking: Reported on 02/12/2020) 15 tablet 0   metFORMIN (GLUCOPHAGE) 500 MG tablet Take 1 tablet (500 mg total) by mouth 2 (two) times daily with a meal. 180 tablet 3   metroNIDAZOLE (FLAGYL) 500 MG tablet Take 1 tablet (500 mg total) by mouth 2 (two) times daily. (Patient not taking: Reported on 02/12/2020) 14 tablet 0    predniSONE (DELTASONE) 50 MG tablet Take 1 tablet (50 mg total) by mouth daily. (Patient not taking: Reported on 02/12/2020) 5 tablet 0   No current facility-administered medications on file prior to visit.    BP 131/70    Pulse 72    Temp 98.2 F (36.8 C)    Resp 18    Ht 5\' 2"  (1.575 m)    Wt 175 lb (79.4 kg)    SpO2 100%    BMI 32.01 kg/m       Objective:   Physical Exam   General Mental Status- Alert. General Appearance- Not in acute distress.   Skin General: Color- Normal Color. Moisture- Normal Moisture.  Neck Carotid Arteries- Normal color. Moisture- Normal Moisture. No carotid bruits. No JVD.  Chest and Lung Exam Auscultation: Breath Sounds:-Normal.  Cardiovascular Auscultation:Rythm- Regular. Murmurs & Other Heart Sounds:Auscultation of the heart reveals- No Murmurs.  Abdomen Inspection:-Inspeection Normal. Palpation/Percussion:Note:No mass. Palpation and Percussion of the abdomen reveal- Non Tender, Non Distended + BS, no rebound or guarding.   Neurologic Cranial Nerve exam:- CN III-XII intact(No nystagmus), symmetric smile. Strength:- 5/5 equal and symmetric strength both upper and lower extremities.      Assessment & Plan:   For you wellness exam today I have ordered cbc, cmp andlipid panel and hiv.  Std screening to include urine ancillary, rpr, hiv and at pt request hsv 1 and hsv studies. No current lesions. No history of any blisters/herpes outbreak per pt.  Flu vacccine declined  Recommend exercise and healthy diet.  We will let you know lab results as they come in.  Follow up date appointment will be determined after lab review.     Diabetes with slight elevated a1c last year. You have not been on metformin. Will repeat a1c today to see if needed. Recommend eat low sugar diet and get regular exercise.  Htn- your bp is borderline today. Bp level is 140/88. Better to be close to 130/80. Refilling both bp meds today. Check your bp daily and  update me in one week on readings.   Follow up in 3-6 months or sooner if needed.  , Esperanza Richters   [place] charge today in addition to wellness exam. Addressed diabetes and htn. Have not seen her in more than one year.

## 2021-09-29 NOTE — Patient Instructions (Addendum)
For you wellness exam today I have ordered cbc, cmp andlipid panel and hiv.  Std screening to include urine ancillary, rpr, hiv and at pt request hsv 1 and hsv studies. No current lesions. No history of any blisters/herpes outbreak per pt.  Flu vacccine declined  Recommend exercise and healthy diet.  We will let you know lab results as they come in.  Placing referral again to gyn and gi for screening studies. Also mammogram order placed.  Follow up date appointment will be determined after lab review.     Diabetes with slight elevated a1c last year. You have not been on metformin. Will repeat a1c today to see if needed. Recommend eat low sugar diet and get regular exercise.  Htn- your bp is borderline today. Bp level is 140/88. Better to be close to 130/80. Refilling both bp meds today. Check your bp daily and update me in one week on readings.   Follow up in 3-6 months or sooner if needed.  Preventive Care 73-25 Years Old, Female Preventive care refers to lifestyle choices and visits with your health care provider that can promote health and wellness. Preventive care visits are also called wellness exams. What can I expect for my preventive care visit? Counseling Your health care provider may ask you questions about your: Medical history, including: Past medical problems. Family medical history. Pregnancy history. Current health, including: Menstrual cycle. Method of birth control. Emotional well-being. Home life and relationship well-being. Sexual activity and sexual health. Lifestyle, including: Alcohol, nicotine or tobacco, and drug use. Access to firearms. Diet, exercise, and sleep habits. Work and work Statistician. Sunscreen use. Safety issues such as seatbelt and bike helmet use. Physical exam Your health care provider will check your: Height and weight. These may be used to calculate your BMI (body mass index). BMI is a measurement that tells if you are at a  healthy weight. Waist circumference. This measures the distance around your waistline. This measurement also tells if you are at a healthy weight and may help predict your risk of certain diseases, such as type 2 diabetes and high blood pressure. Heart rate and blood pressure. Body temperature. Skin for abnormal spots. What immunizations do I need? Vaccines are usually given at various ages, according to a schedule. Your health care provider will recommend vaccines for you based on your age, medical history, and lifestyle or other factors, such as travel or where you work. What tests do I need? Screening Your health care provider may recommend screening tests for certain conditions. This may include: Lipid and cholesterol levels. Diabetes screening. This is done by checking your blood sugar (glucose) after you have not eaten for a while (fasting). Pelvic exam and Pap test. Hepatitis B test. Hepatitis C test. HIV (human immunodeficiency virus) test. STI (sexually transmitted infection) testing, if you are at risk. Lung cancer screening. Colorectal cancer screening. Mammogram. Talk with your health care provider about when you should start having regular mammograms. This may depend on whether you have a family history of breast cancer. BRCA-related cancer screening. This may be done if you have a family history of breast, ovarian, tubal, or peritoneal cancers. Bone density scan. This is done to screen for osteoporosis. Talk with your health care provider about your test results, treatment options, and if necessary, the need for more tests. Follow these instructions at home: Eating and drinking  Eat a diet that includes fresh fruits and vegetables, whole grains, lean protein, and low-fat dairy products. Take vitamin and  mineral supplements as recommended by your health care provider. Do not drink alcohol if: Your health care provider tells you not to drink. You are pregnant, may be  pregnant, or are planning to become pregnant. If you drink alcohol: Limit how much you have to 0-1 drink a day. Know how much alcohol is in your drink. In the [place] one drink equals one 12 oz bottle of beer (355 mL), one 5 oz glass of wine (148 mL), or one 1 oz glass of hard liquor (44 mL). Lifestyle Brush your teeth every morning and night with fluoride toothpaste. Floss one time each day. Exercise for at least 30 minutes 5 or more days each week. Do not use any products that contain nicotine or tobacco. These products include cigarettes, chewing tobacco, and vaping devices, such as e-cigarettes. If you need help quitting, ask your health care provider. Do not use drugs. If you are sexually active, practice safe sex. Use a condom or other form of protection to prevent STIs. If you do not wish to become pregnant, use a form of birth control. If you plan to become pregnant, see your health care provider for a prepregnancy visit. Take aspirin only as told by your health care provider. Make sure that you understand how much to take and what form to take. Work with your health care provider to find out whether it is safe and beneficial for you to take aspirin daily. Find healthy ways to manage stress, such as: Meditation, yoga, or listening to music. Journaling. Talking to a trusted person. Spending time with friends and family. Minimize exposure to UV radiation to reduce your risk of skin cancer. Safety Always wear your seat belt while driving or riding in a vehicle. Do not drive: If you have been drinking alcohol. Do not ride with someone who has been drinking. When you are tired or distracted. While texting. If you have been using any mind-altering substances or drugs. Wear a helmet and other protective equipment during sports activities. If you have firearms in your house, make sure you follow all gun safety procedures. Seek help if you have been physically or sexually abused. What's  next? Visit your health care provider once a year for an annual wellness visit. Ask your health care provider how often you should have your eyes and teeth checked. Stay up to date on all vaccines. This information is not intended to replace advice given to you by your health care provider. Make sure you discuss any questions you have with your health care provider. Document Revised: 03/31/2021 Document Reviewed: 03/31/2021 Elsevier Patient Education  [employer].

## 2021-09-30 ENCOUNTER — Encounter: Payer: Self-pay | Admitting: Medical

## 2021-09-30 LAB — HSV(HERPES SIMPLEX VRS) I + II AB-IGG
HSV 1 IGG,TYPE SPECIFIC AB: 53.9 {index} — ABNORMAL HIGH
HSV 2 IGG,TYPE SPECIFIC AB: 20.1 {index} — ABNORMAL HIGH

## 2021-09-30 LAB — URINE CYTOLOGY ANCILLARY ONLY
Bacterial Vaginitis-Urine: NEGATIVE
Candida Urine: NEGATIVE
Chlamydia: NEGATIVE
Comment: NEGATIVE
Comment: NEGATIVE
Comment: NORMAL
Neisseria Gonorrhea: NEGATIVE
Trichomonas: POSITIVE — AB

## 2021-09-30 LAB — SYPHILIS: RPR W/REFLEX TO RPR TITER AND TREPONEMAL ANTIBODIES, TRADITIONAL SCREENING AND DIAGNOSIS ALGORITHM: RPR Ser Ql: NONREACTIVE

## 2021-09-30 LAB — HIV ANTIBODY (ROUTINE TESTING W REFLEX): HIV 1&2 Ab, 4th Generation: NONREACTIVE

## 2021-09-30 MED ORDER — METRONIDAZOLE 500 MG PO TABS: 500.0000 mg | ORAL_TABLET | Freq: Three times a day (TID) | ORAL | 0 refills | Status: AC

## 2021-09-30 NOTE — Addendum Note (Signed)
Addended by: Gwenevere Abbot on: 09/30/2021 04:34 PM   Modules accepted: Orders

## 2021-10-04 ENCOUNTER — Telehealth: Payer: Managed Care, Other (non HMO) | Admitting: Medical

## 2021-10-04 DIAGNOSIS — A599 Trichomoniasis, unspecified: Secondary | ICD-10-CM | POA: Diagnosis not present

## 2021-10-04 DIAGNOSIS — Z113 Encounter for screening for infections with a predominantly sexual mode of transmission: Secondary | ICD-10-CM

## 2021-10-04 NOTE — Progress Notes (Signed)
° °  Subjective:    Patient ID: Holly Barnett, female    DOB: 11-04-1975, 45 y.o.   MRN: [id number]  HPI Virtual Visit via Telephone Note  I connected with Carlene Coria on 10/04/21 at  4:20 PM EST by telephone and verified that I am speaking with the correct person using two identifiers.  Location: Patient: home Provider: office   I discussed the limitations, risks, security and privacy concerns of performing an evaluation and management service by telephone and the availability of in person appointments. I also discussed with the patient that there may be a patient responsible charge related to this service. The patient expressed understanding and agreed to proceed.   History of Present Illness: Pt had + trichomonas. Pt did not have any symptoms. Pt aware partner should be treated. Will put future order in.   Pt has + igg hsv antbody. She has never had outbreak. Pt current partner for 1.5 years. This thanksgiving unprotected sex.  Pt is aware of herpes out breaks look like.  Pt never had cold sore or any gential type outbreak.   Observations/Objective: General-no acute distress, pleasant, oriented. Lungs- on inspection lungs appear unlabored. Neck- no tracheal deviation or jvd on inspection. Neuro- gross motor function appears intact.   Assessment and Plan: Patient Instructions  Recent urine ancillary studies show trichomonas.  I prescribed Flagyl and we decided we will get repeat urine ancillary test in 2 weeks to confirm effective treatment.  Also reminded that you get partner evaluated and treated for trichomonas as well.   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  However on review you have never remember having any sort of cold sore or any blister/HSV type outbreak in genital area.  His test results do not match clinical history we will go ahead and repeat HSV IgG antibodies in 2 weeks as well.  If again antibody studies are elevated consider referring to infectious disease MD to  get opinion.  Follow-up date to be determined after upcoming repeat labs.   Esperanza Richters, PA-C   Time spent with patient today was 20  minutes which consisted of chart review, discussing diagnosis, work up treatment and documentation.   Follow Up Instructions:    I discussed the assessment and treatment plan with the patient. The patient was provided an opportunity to ask questions and all were answered. The patient agreed with the plan and demonstrated an understanding of the instructions.   The patient was advised to call back or seek an in-person evaluation if the symptoms worsen or if the condition fails to improve as anticipated.     Esperanza Richters, PA-C    Review of Systems     Objective:   Physical Exam        Assessment & Plan:

## 2021-10-04 NOTE — Patient Instructions (Signed)
Recent urine ancillary studies show trichomonas.  I prescribed Flagyl and we decided we will get repeat urine ancillary test in 2 weeks to confirm effective treatment.  Also reminded that you get partner evaluated and treated for trichomonas as well.   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  However on review you have never remember having any sort of cold sore or any blister/HSV type outbreak in genital area.  His test results do not match clinical history we will go ahead and repeat HSV IgG antibodies in 2 weeks as well.  If again antibody studies are elevated consider referring to infectious disease MD to get opinion.  Follow-up date to be determined after upcoming repeat labs.

## 2021-10-21 ENCOUNTER — Ambulatory Visit (HOSPITAL_BASED_OUTPATIENT_CLINIC_OR_DEPARTMENT_OTHER): Payer: Managed Care, Other (non HMO)

## 2021-10-26 ENCOUNTER — Other Ambulatory Visit: Payer: Self-pay

## 2021-10-26 ENCOUNTER — Ambulatory Visit (HOSPITAL_BASED_OUTPATIENT_CLINIC_OR_DEPARTMENT_OTHER)
Admission: RE | Admit: 2021-10-26 | Discharge: 2021-10-26 | Disposition: A | Discharge status: Home / Self Care | Payer: Managed Care, Other (non HMO) | Source: Ambulatory Visit | Attending: Medical | Admitting: Medical

## 2021-10-26 ENCOUNTER — Encounter (HOSPITAL_BASED_OUTPATIENT_CLINIC_OR_DEPARTMENT_OTHER): Payer: Self-pay

## 2021-10-26 DIAGNOSIS — Z1231 Encounter for screening mammogram for malignant neoplasm of breast: Secondary | ICD-10-CM | POA: Diagnosis present

## 2021-10-28 ENCOUNTER — Other Ambulatory Visit: Payer: Self-pay | Admitting: Medical

## 2021-10-28 DIAGNOSIS — R928 Other abnormal and inconclusive findings on diagnostic imaging of breast: Secondary | ICD-10-CM

## 2021-11-18 ENCOUNTER — Ambulatory Visit
Admission: RE | Admit: 2021-11-18 | Discharge: 2021-11-18 | Disposition: A | Discharge status: Home / Self Care | Payer: Managed Care, Other (non HMO) | Source: Ambulatory Visit | Attending: Medical | Admitting: Medical

## 2021-11-18 ENCOUNTER — Other Ambulatory Visit: Payer: Self-pay | Admitting: Medical

## 2021-11-18 ENCOUNTER — Other Ambulatory Visit: Payer: Managed Care, Other (non HMO)

## 2021-11-18 DIAGNOSIS — R928 Other abnormal and inconclusive findings on diagnostic imaging of breast: Secondary | ICD-10-CM

## 2021-12-09 ENCOUNTER — Encounter: Payer: Managed Care, Other (non HMO) | Admitting: Family Medicine

## 2021-12-17 ENCOUNTER — Other Ambulatory Visit: Payer: Self-pay

## 2021-12-17 ENCOUNTER — Other Ambulatory Visit (HOSPITAL_COMMUNITY)
Admission: RE | Admit: 2021-12-17 | Discharge: 2021-12-17 | Disposition: A | Discharge status: Home / Self Care | Payer: Managed Care, Other (non HMO) | Source: Ambulatory Visit | Attending: Family Medicine | Admitting: Family Medicine

## 2021-12-17 ENCOUNTER — Encounter: Payer: Self-pay | Admitting: Family Medicine

## 2021-12-17 ENCOUNTER — Ambulatory Visit (INDEPENDENT_AMBULATORY_CARE_PROVIDER_SITE_OTHER): Payer: Managed Care, Other (non HMO) | Admitting: Family Medicine

## 2021-12-17 VITALS — BP 117/81 | HR 71 | Ht 62.0 in | Wt 173.0 lb

## 2021-12-17 DIAGNOSIS — N920 Excessive and frequent menstruation with regular cycle: Secondary | ICD-10-CM | POA: Diagnosis not present

## 2021-12-17 DIAGNOSIS — Z01419 Encounter for gynecological examination (general) (routine) without abnormal findings: Secondary | ICD-10-CM | POA: Diagnosis not present

## 2021-12-17 DIAGNOSIS — N63 Unspecified lump in unspecified breast: Secondary | ICD-10-CM

## 2021-12-17 NOTE — Progress Notes (Signed)
? ? ?GYNECOLOGY ANNUAL PREVENTATIVE CARE ENCOUNTER NOTE ? ?Subjective:  ? Holly Barnett is a 46 y.o. G40P2002 female here for a routine annual gynecologic exam.  Current complaints: menorrhagia with regular cycle. Has extremely heavy periods. Had endometrial ablation about 5 years ago. Didn't have a period for 3 years, but then started having bleeding again last May.  Her bleeding has slowly increased and now has to use a super tampon to help control bleeding..   Denies abnormal vaginal bleeding, discharge, pelvic pain, problems with intercourse or other gynecologic concerns.  ?  ?Gynecologic History ?Patient's last menstrual period was 12/09/2021 (exact date). ?Patient is sexually active  ?Contraception: tubal ligation ?Last Pap: has history of abnormal PAP with colpo. Low level changes. Has been a few years since last PAP ?Last mammogram: Had lump on mammogram with [place]. Has follow up in 6 months.  ? ?The pregnancy intention screening data noted above was reviewed. Potential methods of contraception were discussed. The patient elected to proceed with No data recorded. ? ? ?Obstetric History ?OB History  ?Gravida Para Term Preterm AB Living  ?2 2 2     2   ?SAB IAB Ectopic Multiple Live Births  ?        2  ?  ?# Outcome Date GA Lbr Len/2nd Weight Sex Delivery Anes PTL Lv  ?2 Term 1997 [redacted]w[redacted]d   F Vag-Spont EPI N LIV  ?1 Term 1996 [redacted]w[redacted]d   M Vag-Spont EPI N LIV  ? ? ?Past Medical History:  ?Diagnosis Date  ? Hypertension   ? ? ?Past Surgical History:  ?Procedure Laterality Date  ? BACK SURGERY    ? CHOLECYSTECTOMY    ? ? ?Current Outpatient Medications on File Prior to Visit  ?Medication Sig Dispense Refill  ? amLODipine (NORVASC) 10 MG tablet Take 1 tablet (10 mg total) by mouth daily. 90 tablet 3  ? chlorthalidone (HYGROTON) 25 MG tablet Take 1 tablet (25 mg total) by mouth daily. 90 tablet 3  ? cyclobenzaprine (FLEXERIL) 10 MG tablet Take 1 tablet (10 mg total) by mouth at bedtime. (Patient not taking: Reported on  02/12/2020) 10 tablet 0  ? fluconazole (DIFLUCAN) 150 MG tablet Take 1 tablet today, take another tablet in 72 hours. (Patient not taking: Reported on 02/12/2020) 2 tablet 0  ? HYDROcodone-acetaminophen (NORCO/VICODIN) 5-325 MG tablet Take 1 tablet by mouth every 6 (six) hours as needed for moderate pain. (Patient not taking: Reported on 02/12/2020) 15 tablet 0  ? metFORMIN (GLUCOPHAGE) 500 MG tablet Take 1 tablet (500 mg total) by mouth 2 (two) times daily with a meal. 180 tablet 3  ? predniSONE (DELTASONE) 50 MG tablet Take 1 tablet (50 mg total) by mouth daily. (Patient not taking: Reported on 02/12/2020) 5 tablet 0  ? ?No current facility-administered medications on file prior to visit.  ? ? ?No Known Allergies ? ?Social History  ? ?Socioeconomic History  ? Marital status: Divorced  ?  Spouse name: Not on file  ? Number of children: Not on file  ? Years of education: Not on file  ? Highest education level: Not on file  ?Occupational History  ? Occupation: cna  ?Tobacco Use  ? Smoking status: Never  ? Smokeless tobacco: Never  ?Substance and Sexual Activity  ? Alcohol use: Yes  ?  Comment: occ  ? Drug use: No  ? Sexual activity: Not Currently  ?  Birth control/protection: None  ?Other Topics Concern  ? Not on file  ?Social History Narrative  ?  Not on file  ? ?Social Determinants of Health  ? ?Financial Resource Strain: Not on file  ?Food Insecurity: Not on file  ?Transportation Needs: Not on file  ?Physical Activity: Not on file  ?Stress: Not on file  ?Social Connections: Not on file  ?Intimate Partner Violence: Not on file  ? ? ?Family History  ?Problem Relation Age of Onset  ? Breast cancer Neg Hx   ? ? ?The following portions of the patient's history were reviewed and updated as appropriate: allergies, current medications, past family history, past medical history, past social history, past surgical history and problem list. ? ?Review of Systems ?Pertinent items are noted in HPI. ?  ?Objective:  ?BP 117/81    Pulse 71   Ht 5\' 2"  (1.575 m)   Wt 173 lb (78.5 kg)   LMP 12/09/2021 (Exact Date)   BMI 31.64 kg/m?  ?Wt Readings from Last 3 Encounters:  ?12/17/21 173 lb (78.5 kg)  ?09/29/21 175 lb (79.4 kg)  ?06/16/20 179 lb 9.6 oz (81.5 kg)  ?  ? ?Chaperone present during exam ? ?CONSTITUTIONAL: Well-developed, well-nourished female in no acute distress.  ?HENT:  Normocephalic, atraumatic, External right and left ear normal. Oropharynx is clear and moist ?EYES: Conjunctivae and EOM are normal. Pupils are equal, round, and reactive to light. No scleral icterus.  ?NECK: Normal range of motion, supple, no masses.  Normal thyroid.  ? ?CARDIOVASCULAR: Normal heart rate noted, regular rhythm ?RESPIRATORY: Clear to auscultation bilaterally. Effort and breath sounds normal, no problems with respiration noted. ?BREASTS: Symmetric in size. No masses, skin changes, nipple drainage, or lymphadenopathy. ?ABDOMEN: Soft, normal bowel sounds, no distention noted.  No tenderness, rebound or guarding.  ?PELVIC: Normal appearing external genitalia; normal appearing vaginal mucosa and cervix.  No abnormal discharge noted.  Normal uterine size, no other palpable masses, no uterine or adnexal tenderness. ?MUSCULOSKELETAL: Normal range of motion. No tenderness.  No cyanosis, clubbing, or edema.  2+ distal pulses. ?SKIN: Skin is warm and dry. No rash noted. Not diaphoretic. No erythema. No pallor. ?NEUROLOGIC: Alert and oriented to person, place, and time. Normal reflexes, muscle tone coordination. No cranial nerve deficit noted. ?PSYCHIATRIC: Normal mood and affect. Normal behavior. Normal judgment and thought content. ? ?Assessment:  ?Annual gynecologic examination with pap smear ?  ?Plan:  ?1. Well Woman Exam ?Will follow up results of pap smear and manage accordingly. ?Mammogram scheduled ?STD testing discussed. Patient requested testing ? ?- Ambulatory referral to Gastroenterology ?- Cytology - PAP( [place]) ? ?2. Menorrhagia with regular  cycle ?Discussed hormonal vs surgical options. Patient think ing about options. Check CBC, CMP, 06/18/20. ?- CBC ?- Thyroid Panel With TSH ?- US PELVIC COMPLETE WITH TRANSVAGINAL; Future ? ?3. Breast mass in female ?Has breast US follow up in 6 months. ? ? ? ?Routine preventative health maintenance measures emphasized. ?Please refer to After Visit Summary for other counseling recommendations.  ? ? ?[place], DO ?[facility] ?

## 2021-12-18 LAB — THYROID PANEL WITH TSH
Free Thyroxine Index: 2.4 (ref 1.2–4.9)
T3 Uptake Ratio: 25 % (ref 24–39)
T4, Total: 9.7 ug/dL (ref 4.5–12.0)
TSH: 0.746 u[IU]/mL (ref 0.450–4.500)

## 2021-12-18 LAB — CBC
Hematocrit: 39.9 % (ref 34.0–46.6)
Hemoglobin: 13.8 g/dL (ref 11.1–15.9)
MCH: 30.4 pg (ref 26.6–33.0)
MCHC: 34.6 g/dL (ref 31.5–35.7)
MCV: 88 fL (ref 79–97)
Platelets: 300 10*3/uL (ref 150–450)
RBC: 4.54 x10E6/uL (ref 3.77–5.28)
RDW: 12.9 % (ref 11.7–15.4)
WBC: 4.6 10*3/uL (ref 3.4–10.8)

## 2021-12-20 LAB — CYTOLOGY - PAP
Comment: NEGATIVE
Diagnosis: NEGATIVE
High risk HPV: POSITIVE — AB

## 2021-12-21 ENCOUNTER — Telehealth (HOSPITAL_BASED_OUTPATIENT_CLINIC_OR_DEPARTMENT_OTHER): Payer: Self-pay

## 2021-12-21 ENCOUNTER — Telehealth: Payer: Self-pay

## 2021-12-21 DIAGNOSIS — B9689 Other specified bacterial agents as the cause of diseases classified elsewhere: Secondary | ICD-10-CM

## 2021-12-21 MED ORDER — METRONIDAZOLE 500 MG PO TABS: 500.0000 mg | ORAL_TABLET | Freq: Two times a day (BID) | ORAL | 0 refills | Status: DC

## 2021-12-21 NOTE — Telephone Encounter (Signed)
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 Advised pt to have partner treated and to not have sex for 7 days after she and partner has been treated. Pt also made aware that she will need to repeat her pap in 1 year. Flagyl 500 mg 1 tablet BID x 7 days was sent to her pharmacy. Understanding was voiced. ?Beulah Matusek l Raelie Lohr, CMA  ?

## 2021-12-23 ENCOUNTER — Encounter: Payer: Self-pay | Admitting: Family Medicine

## 2021-12-23 DIAGNOSIS — R87811 Vaginal high risk human papillomavirus (HPV) DNA test positive: Secondary | ICD-10-CM | POA: Insufficient documentation

## 2022-05-19 ENCOUNTER — Other Ambulatory Visit: Payer: Managed Care, Other (non HMO)

## 2022-05-19 ENCOUNTER — Inpatient Hospital Stay: Admission: RE | Admit: 2022-05-19 | Payer: Managed Care, Other (non HMO) | Source: Ambulatory Visit

## 2023-03-21 ENCOUNTER — Telehealth (INDEPENDENT_AMBULATORY_CARE_PROVIDER_SITE_OTHER): Payer: Managed Care, Other (non HMO) | Admitting: Medical

## 2023-03-21 DIAGNOSIS — N898 Other specified noninflammatory disorders of vagina: Secondary | ICD-10-CM | POA: Diagnosis not present

## 2023-03-21 MED ORDER — METRONIDAZOLE 500 MG PO TABS: 500.0000 mg | ORAL_TABLET | Freq: Three times a day (TID) | ORAL | 0 refills | Status: AC

## 2023-03-21 NOTE — Patient Instructions (Addendum)
Vaginal discharge recently with history of trichomonas and bacterial vaginosis on review of lab studies done in the past.  Also HPV found on last Pap smear in 2023.  Patient chose video visit today and indicates that typically for in person appointments she has to give her employer 21-day advance notice.  So thought best in light of patient history to give Flagyl treatment for 7 days.  Rx sent to pharmacy.  Also went ahead and placed referral back to gynecologist Dr. Adrian Blackwater as a pelvic exam would be beneficial to evaluate if any infection present.  Patient is in agreement with the referral.  On discussion patient has no current partner presently.  Follow-up date to be determined after gynecologist referral.

## 2023-03-21 NOTE — Progress Notes (Signed)
   Subjective:    Patient ID: Holly Barnett, female    DOB: [birth year], 47 y.o.   MRN: [id number]  HPI Virtual Visit via Video Note  I connected with Holly Barnett on 03/21/23 at  1:00 PM EDT by a video enabled telemedicine application and verified that I am speaking with the correct person using two identifiers.  Location: Patient: office Provider: home in [place].   I discussed the limitations of evaluation and management by telemedicine and the availability of in person appointments. The patient expressed understanding and agreed to proceed.  History of Present Illness: Pt has some recurrent discharge. She states some vaginal dc. She states no odor but she states odor of some sort.   Pt in the past had trichomonas and she had bv in the remote past 5- years ago had bv.  Pt received flagyl for prior infections.  March 2023 pt had high risk hpv. Pt also had some trichomonas on 12-17-2021. Rx was sent in on 12-22-2022.  Pt states she needs to give notice to her work of 21 days.(   Observations/Objective: General-no acute distress, pleasant, oriented. Lungs- on inspection lungs appear unlabored. Neck- no tracheal deviation or jvd on inspection. Neuro- gross motor function appears intact.   Assessment and Plan: Patient Instructions  Vaginal discharge recently with history of trichomonas and bacterial vaginosis on review of lab studies done in the past.  Also HPV found on last Pap smear in 2023.  Patient chose video visit today and indicates that typically for in person appointments she has to give her employer 21-day advance notice.  So thought best in light of patient history to give Flagyl treatment for 7 days.  Rx sent to pharmacy.  Also went ahead and placed referral back to gynecologist Dr. Adrian Blackwater as a pelvic exam would be beneficial to evaluate if any infection present.  Patient is in agreement with the referral.  On discussion patient has no current partner presently.  Follow-up  date to be determined after gynecologist referral.   Esperanza Richters, PA-C   Follow Up Instructions:    I discussed the assessment and treatment plan with the patient. The patient was provided an opportunity to ask questions and all were answered. The patient agreed with the plan and demonstrated an understanding of the instructions.   The patient was advised to call back or seek an in-person evaluation if the symptoms worsen or if the condition fails to improve as anticipated.     Esperanza Richters, PA-C    Review of Systems     Objective:   Physical Exam        Assessment & Plan:

## 2023-03-22 ENCOUNTER — Telehealth: Payer: Self-pay

## 2023-03-22 NOTE — Telephone Encounter (Signed)
Called patient to schedule an appointment with Dr. Adrian Blackwater. Left voicemail with our contact information to call back and schedule.

## 2023-03-30 ENCOUNTER — Ambulatory Visit: Payer: Managed Care, Other (non HMO) | Admitting: Medical

## 2023-04-07 ENCOUNTER — Other Ambulatory Visit (HOSPITAL_COMMUNITY)
Admission: RE | Admit: 2023-04-07 | Discharge: 2023-04-07 | Disposition: A | Discharge status: Home / Self Care | Payer: Managed Care, Other (non HMO) | Source: Ambulatory Visit | Attending: Obstetrics and Gynecology | Admitting: Obstetrics and Gynecology

## 2023-04-07 ENCOUNTER — Ambulatory Visit (INDEPENDENT_AMBULATORY_CARE_PROVIDER_SITE_OTHER): Payer: Managed Care, Other (non HMO)

## 2023-04-07 VITALS — BP 130/89 | HR 69 | Wt 173.0 lb

## 2023-04-07 DIAGNOSIS — N898 Other specified noninflammatory disorders of vagina: Secondary | ICD-10-CM | POA: Diagnosis not present

## 2023-04-07 NOTE — Progress Notes (Signed)
SUBJECTIVE:  47 y.o. female complains of white vaginal discharge for 1 week(s). Denies abnormal vaginal bleeding or significant pelvic pain or fever. No UTI symptoms. Denies history of known exposure to STD.  No LMP recorded (lmp unknown).  OBJECTIVE:  She appears well, afebrile. Urine dipstick: not done.  ASSESSMENT:  Vaginal Discharge   PLAN:  GC, chlamydia, trichomonas, BVAG, CVAG probe sent to lab. Treatment: To be determined once lab results are received ROV prn if symptoms persist or worsen.   Garold Sheeler l Lebron Nauert, CMA

## 2023-04-10 LAB — CERVICOVAGINAL ANCILLARY ONLY
Bacterial Vaginitis (gardnerella): NEGATIVE
Candida Glabrata: NEGATIVE
Candida Vaginitis: NEGATIVE
Chlamydia: NEGATIVE
Comment: NEGATIVE
Comment: NEGATIVE
Comment: NEGATIVE
Comment: NEGATIVE
Comment: NEGATIVE
Comment: NORMAL
Neisseria Gonorrhea: NEGATIVE
Trichomonas: NEGATIVE

## 2023-05-09 ENCOUNTER — Ambulatory Visit: Payer: Managed Care, Other (non HMO) | Admitting: Medical

## 2023-05-09 ENCOUNTER — Other Ambulatory Visit (HOSPITAL_COMMUNITY)
Admission: RE | Admit: 2023-05-09 | Discharge: 2023-05-09 | Disposition: A | Discharge status: Home / Self Care | Payer: Managed Care, Other (non HMO) | Source: Ambulatory Visit | Attending: Medical | Admitting: Medical

## 2023-05-09 VITALS — BP 150/94 | HR 72 | Resp 18 | Ht 62.0 in | Wt 175.6 lb

## 2023-05-09 DIAGNOSIS — Z113 Encounter for screening for infections with a predominantly sexual mode of transmission: Secondary | ICD-10-CM | POA: Diagnosis not present

## 2023-05-09 DIAGNOSIS — N898 Other specified noninflammatory disorders of vagina: Secondary | ICD-10-CM | POA: Insufficient documentation

## 2023-05-09 MED ORDER — FLUCONAZOLE 150 MG PO TABS: ORAL_TABLET | ORAL | 1 refills | Status: DC

## 2023-05-09 NOTE — Progress Notes (Signed)
   Subjective:    Patient ID: Holly Barnett, female    DOB: 06-21-1976, 47 y.o.   MRN: [id number]  HPI  Discussed the use of AI scribe software for clinical note transcription with the patient, who gave verbal consent to proceed.  History of Present Illness   The patient, with a history of recurrent vaginal infections, presents with a chief complaint of vaginal discharge persisting for approximately two and a half weeks. The discharge is described as white, solid, and non-irritating/itcing, with no associated odor. This is a recurrent issue, as the patient had a similar episode in early June, which was managed with a seven-day course of Flagyl, presumptively for Trichomonas infection based on past medical history since had video visit. Post-treatment, the patient experienced a brief period of symptom resolution, lasting a few days, before the discharge reappeared but was white at that point.  The patient denies any associated systemic symptoms such as fever, chills, or sweats. There is no reported pain over the bladder, uterus, or cervix area.  However, the patient does report some mild itching associated with the discharge. There is no reported dysuria or urinary frequency, and no pain over the kidney areas. The patient does report some back pain, but this is attributed to a previous injury and is located in the mid-lumbar area, not over the kidneys.       Review of Systems See hpi.    Objective:   Physical Exam  General- No acute distress. Pleasant patient. Neck- Full range of motion, no jvd Lungs- Clear, even and unlabored. Heart- regular rate and rhythm. Neurologic- CNII- XII grossly intact.  Abdomen-soft, nt, nd +bs, no rebound or guarding. Back- no cva tendernes.      Assessment & Plan:  Assessment and Plan    Vaginal Discharge: Recurrent white discharge with mild itching for 2.5 weeks. No odor, dysuria, or pelvic pain. Recent treatment with Flagyl for suspected Trichomonas with  temporary improvement. Possible secondary yeast infection post-antibiotic treatment. -Administer Diflucan one tablet for one day with a refill if needed. -Order vaginal swab for yeast, Trichomonas, gonorrhea, and chlamydia testing.  Back Pain: can use tylenol 500 mg and ibuprofen 200 mg every 8 hours if needed. If pain worsens then get lumbar xray.   keep appt with gyn end of august. will update you on swab studies.   Follow up date to be determined after lab review.       Esperanza Richters, PA-C

## 2023-05-09 NOTE — Patient Instructions (Addendum)
Vaginal Discharge: Recurrent white discharge with mild itching for 2.5 weeks. No odor, dysuria, or pelvic pain. Recent treatment with Flagyl for suspected Trichomonas with temporary improvement. Possible secondary yeast infection post-antibiotic treatment. -Administer Diflucan one tablet for one day with a refill if needed. -Order vaginal swab for yeast, Trichomonas,  bv, gonorrhea, and chlamydia testing.  Back Pain: can use tylenol 500 mg and ibuprofen 200 mg every 8 hours if needed. If pain worsens then get lumbar xray.   keep appt with gyn end of august. will update you on swab studies.   Follow up date to be determined after lab review.

## 2023-05-10 LAB — CERVICOVAGINAL ANCILLARY ONLY
Bacterial Vaginitis (gardnerella): POSITIVE — AB
Candida Glabrata: NEGATIVE
Candida Vaginitis: NEGATIVE
Chlamydia: NEGATIVE
Comment: NEGATIVE
Comment: NEGATIVE
Comment: NEGATIVE
Comment: NEGATIVE
Comment: NEGATIVE
Comment: NORMAL
Neisseria Gonorrhea: NEGATIVE
Trichomonas: POSITIVE — AB

## 2023-05-11 MED ORDER — METRONIDAZOLE 500 MG PO TABS: ORAL_TABLET | ORAL | 0 refills | Status: DC

## 2023-05-11 MED ORDER — METRONIDAZOLE 500 MG PO TABS: 500.0000 mg | ORAL_TABLET | Freq: Three times a day (TID) | ORAL | 0 refills | Status: DC

## 2023-05-11 NOTE — Addendum Note (Signed)
Addended by: Gwenevere Abbot on: 05/11/2023 06:30 AM   Modules accepted: Orders

## 2023-06-08 ENCOUNTER — Ambulatory Visit: Payer: Managed Care, Other (non HMO) | Admitting: Family Medicine

## 2023-07-17 IMAGING — US US BREAST*R* LIMITED INC AXILLA
1 series · 13 of 15 positions shown · non-contrast
Comparison: Screening exam, baseline study, 10/26/2021.

CLINICAL DATA: Recall from baseline screening study for possible
masses in each breast.

EXAM:
DIGITAL DIAGNOSTIC BILATERAL MAMMOGRAM WITH TOMOSYNTHESIS AND CAD;
ULTRASOUND LEFT BREAST LIMITED; ULTRASOUND RIGHT BREAST LIMITED
TECHNIQUE: Bilateral digital diagnostic mammography and breast tomosynthesis
was performed. The images were evaluated with computer-aided
detection.; Targeted ultrasound examination of the left breast was
performed.

[Series 1: us breast*right* limited inc axilla · 0.06mm/px · 13 of 15 slices shown]
[im 1/15]
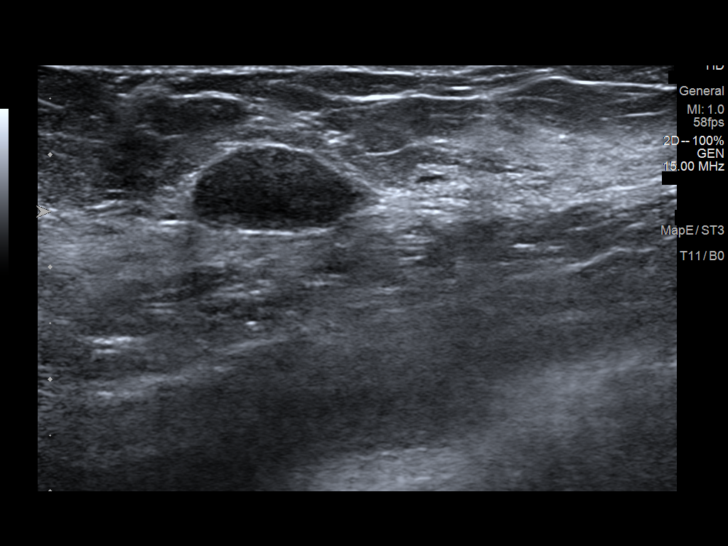
[im 2/15]
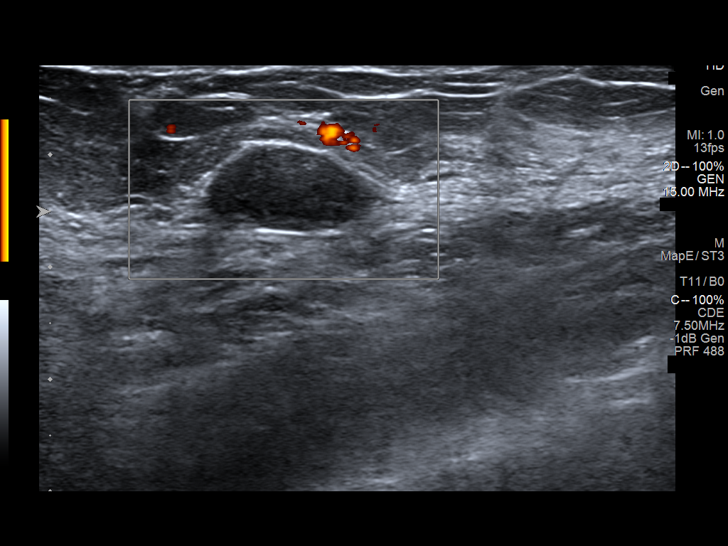
[im 3/15]
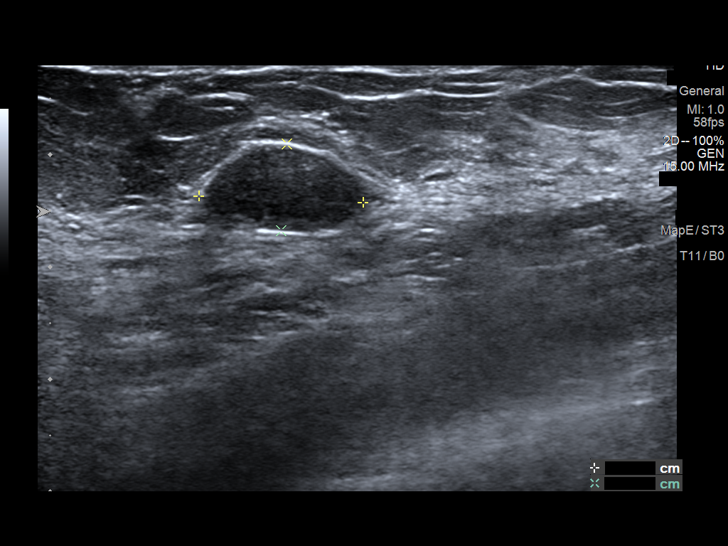
[im 5/15]
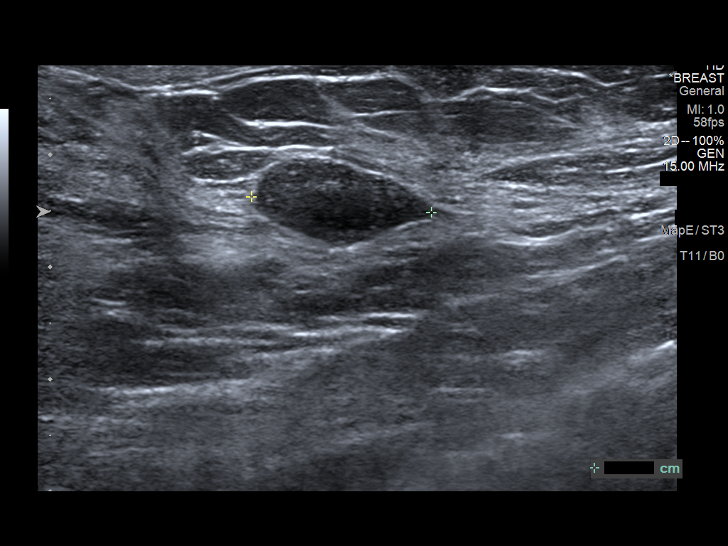
[im 6/15]
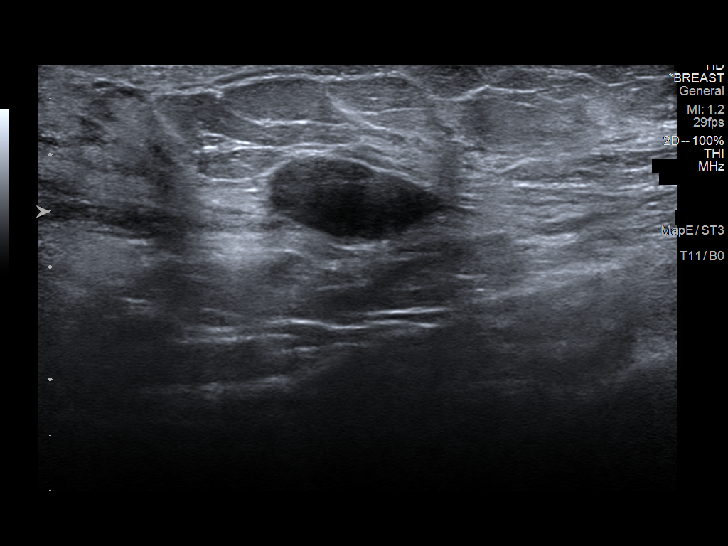
[im 7/15]
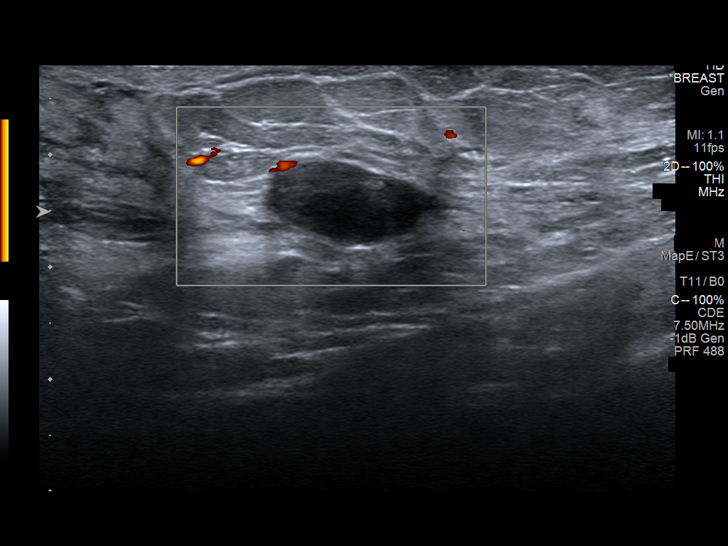
[im 8/15]
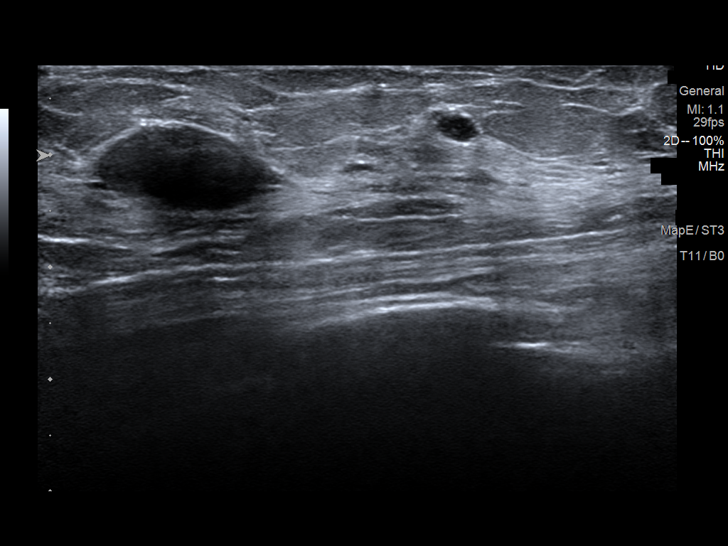
[im 9/15]
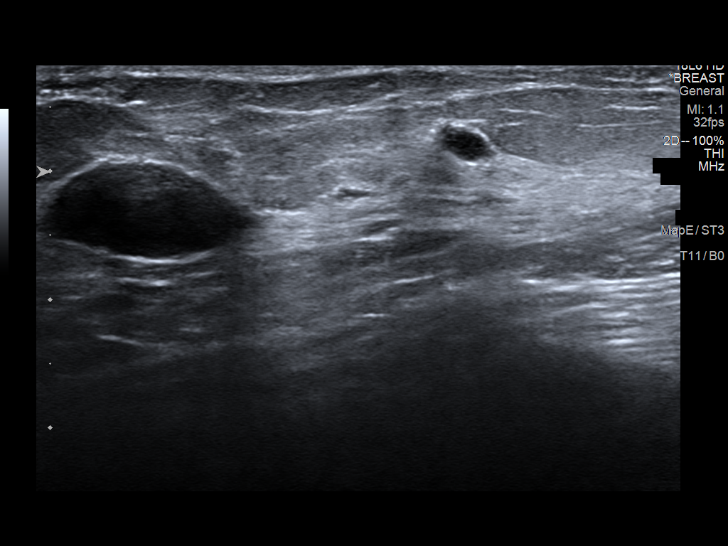
[im 10/15]
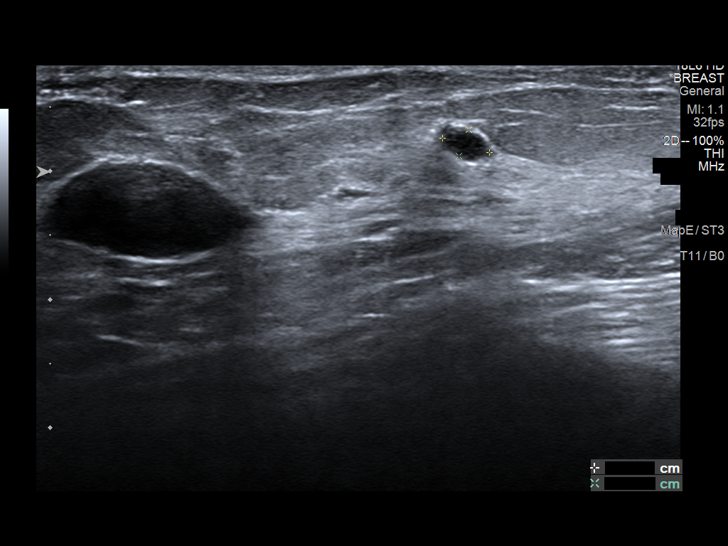
[im 11/15]
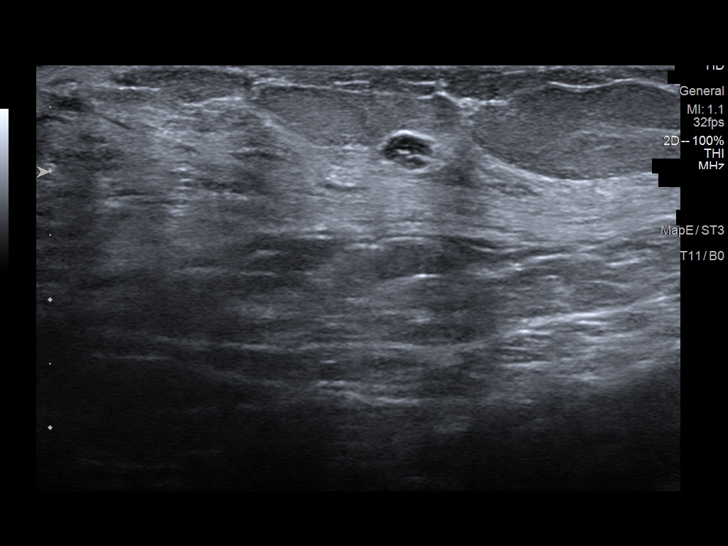
[im 13/15]
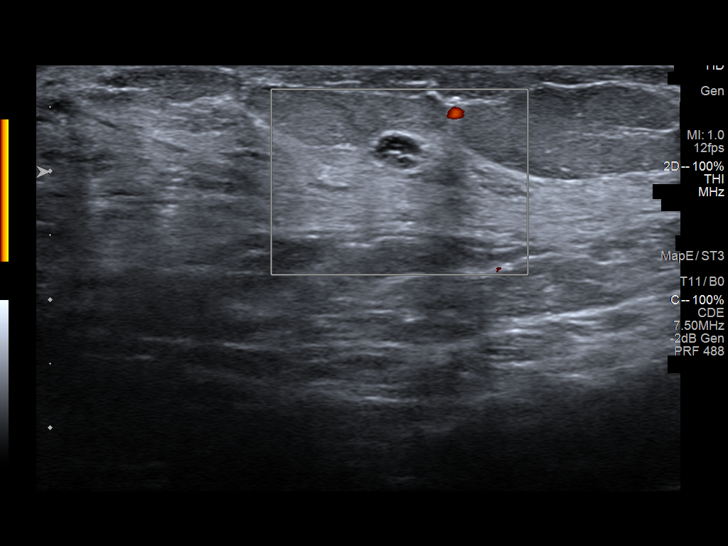
[im 14/15]
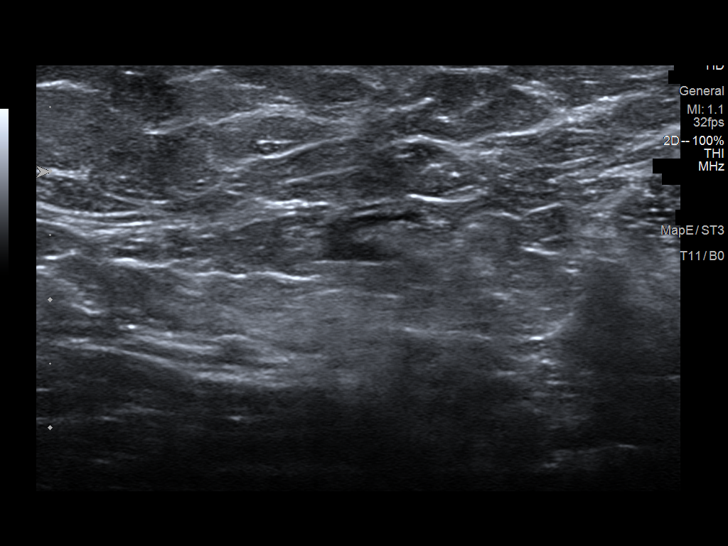
[im 15/15]
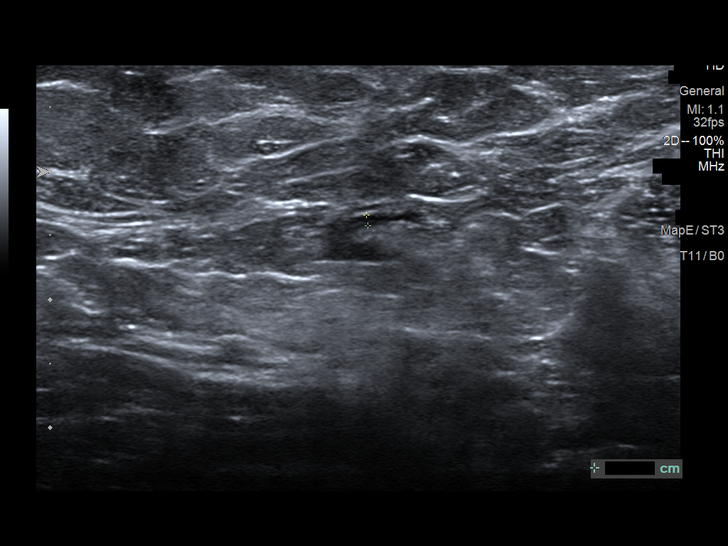

[13 of 15 positions shown; findings below may reference images not displayed]

ACR Breast Density Category c: The breast tissue is heterogeneously
dense, which may obscure small masses.
FINDINGS: On spot compression imaging, the possible masses noted on the
current screening study persist. On the right, there is a 1.5 cm
oval mostly circumscribed mass that projects at 6-7 o'clock. On the
left, there is an oval mass that lies medially, also proximally
cm in greatest dimension. There are no other defined masses, no
areas of architectural distortion and no suspicious calcifications.

Targeted right breast ultrasound is performed, showing an oval
hypoechoic, circumscribed and parallel mass at 6:30 o'clock,
measuring 1.6 x 0.8 x 1.5 cm, consistent in size, shape and location
to the mammographic mass. A second small masses seen adjacent to
this, measuring 4 mm.

Targeted left breast ultrasound is performed, showing an oval
hypoechoic mass with cystic spaces, at 9 o'clock, 2 cm the nipple,
measuring 1.2 x 0.4 x 1.1 cm. There 2 small cysts also noted that
lie at 6:30 o'clock, 2 cm the nipple.
IMPRESSION: 1. Probably benign masses in each breast, on the right a 1.6 cm mass
consistent with a fibroadenoma within the adjacent 4 mm mass that
may reflect a complicated cyst or lymph node. On the left, there is
a 1.2 cm mass that may reflect a fibroadenoma or fibrocystic lesion.
Two small benign cysts are noted at 6:30 o'clock.

RECOMMENDATION:
1. Short-term follow-up with bilateral breast ultrasound in 6 months
to reassess the 2 masses on the right at 6:30 o'clock and the a
single mass on the left at 9 o'clock.

I have discussed the findings and recommendations with the patient.
If applicable, a reminder letter will be sent to the patient
regarding the next appointment.

BI-RADS CATEGORY  3: Probably benign.

## 2023-07-17 IMAGING — MG DIGITAL DIAGNOSTIC BILAT W/ TOMO W/ CAD
8 series · 8 of 24 positions shown · non-contrast
Comparison: Screening exam, baseline study, 10/26/2021.

CLINICAL DATA: Recall from baseline screening study for possible
masses in each breast.

EXAM:
DIGITAL DIAGNOSTIC BILATERAL MAMMOGRAM WITH TOMOSYNTHESIS AND CAD;
ULTRASOUND LEFT BREAST LIMITED; ULTRASOUND RIGHT BREAST LIMITED
TECHNIQUE: Bilateral digital diagnostic mammography and breast tomosynthesis
was performed. The images were evaluated with computer-aided
detection.; Targeted ultrasound examination of the left breast was
performed.

[R CC synth-2D]
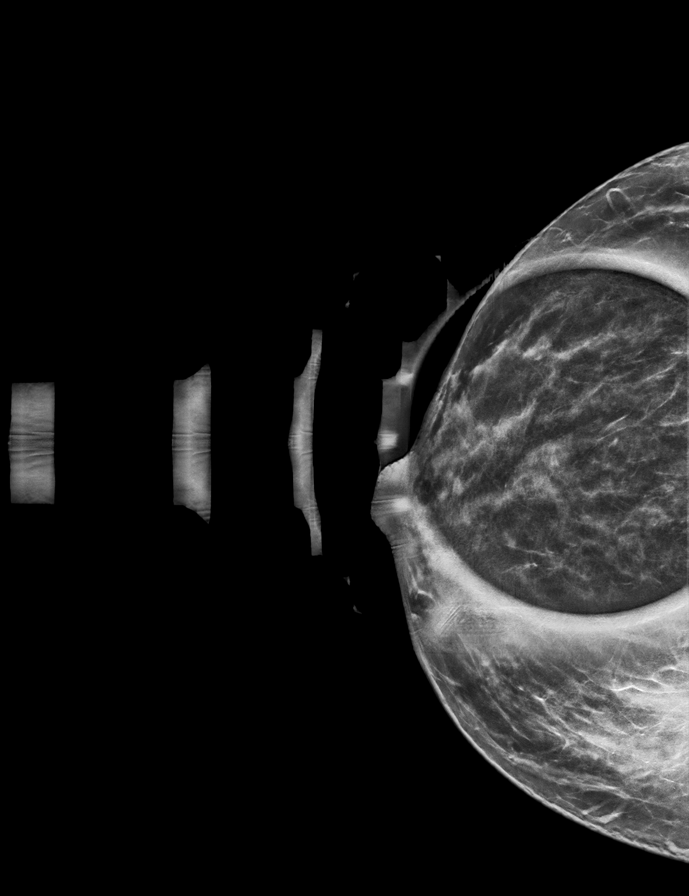

[L MLO synth-2D]
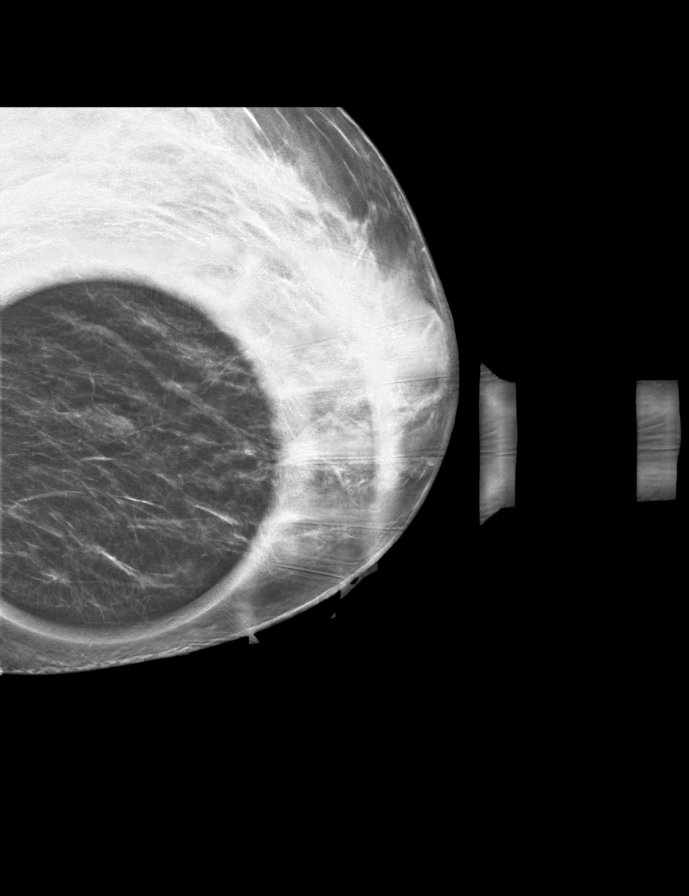

[L CC synth-2D]
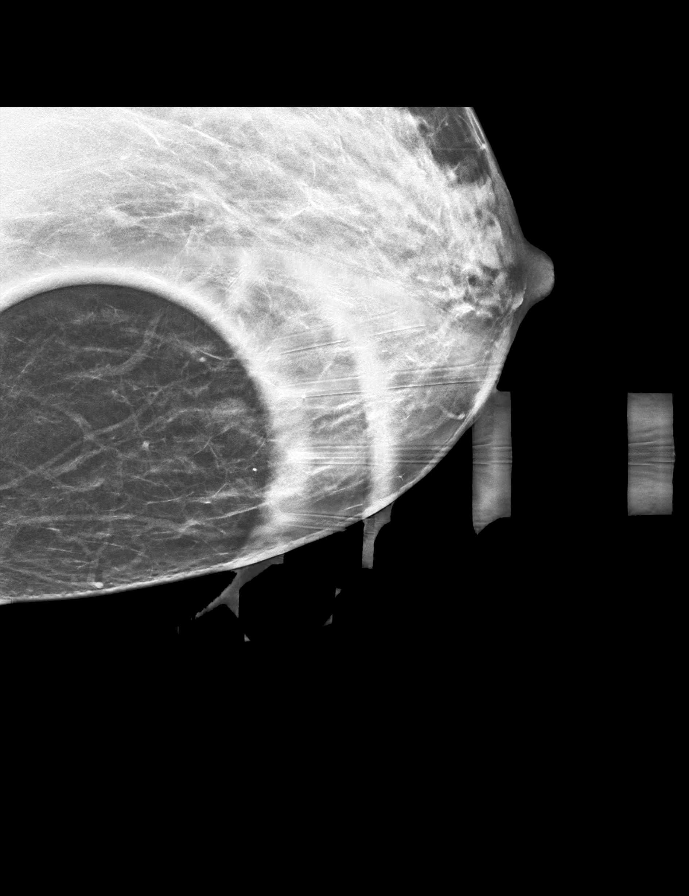

[R MLO synth-2D]
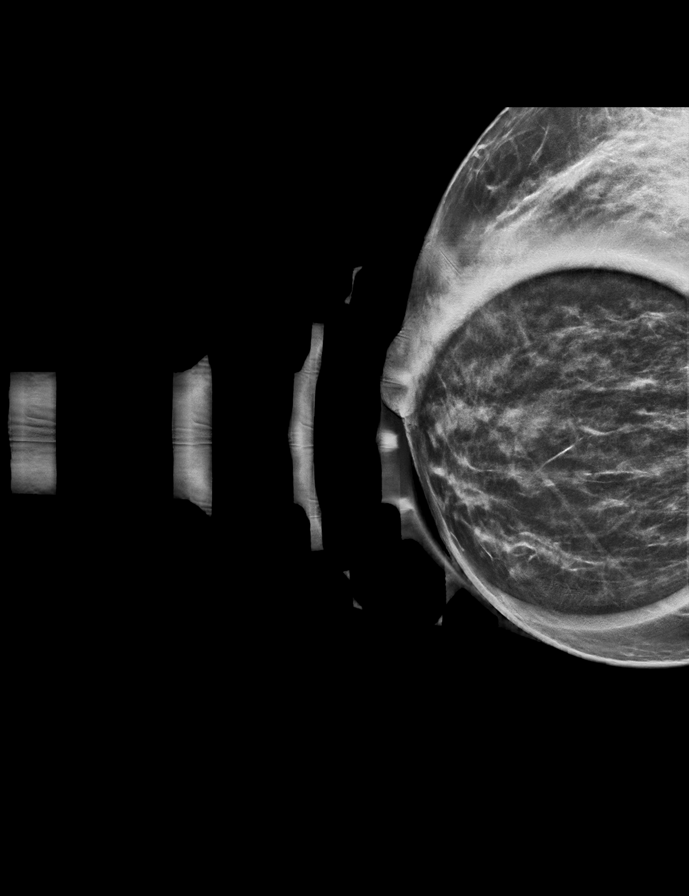

[R CC tomo · tomo slice 25/50.0]
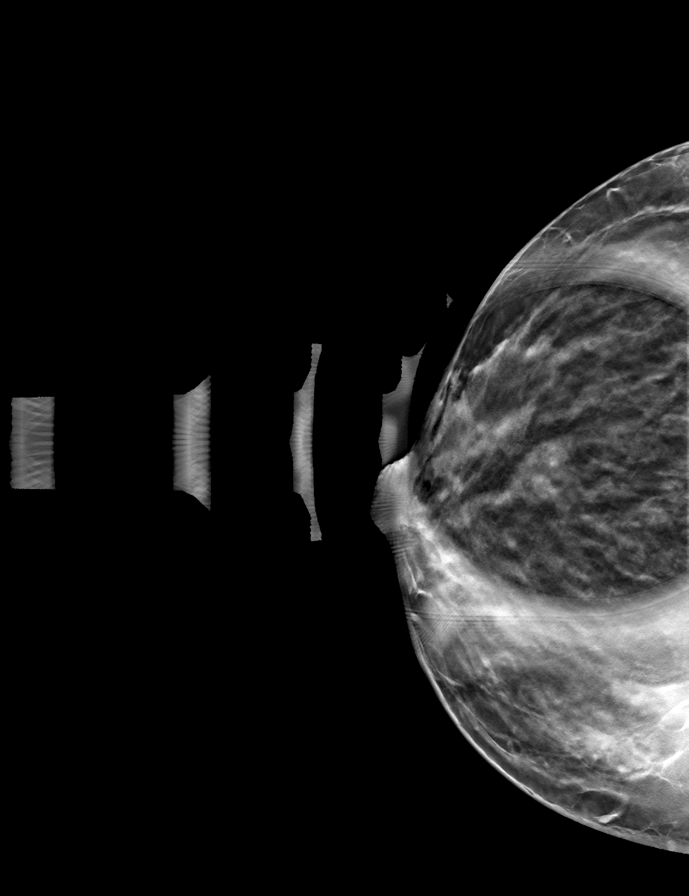

[L CC tomo · tomo slice 24/47.0]
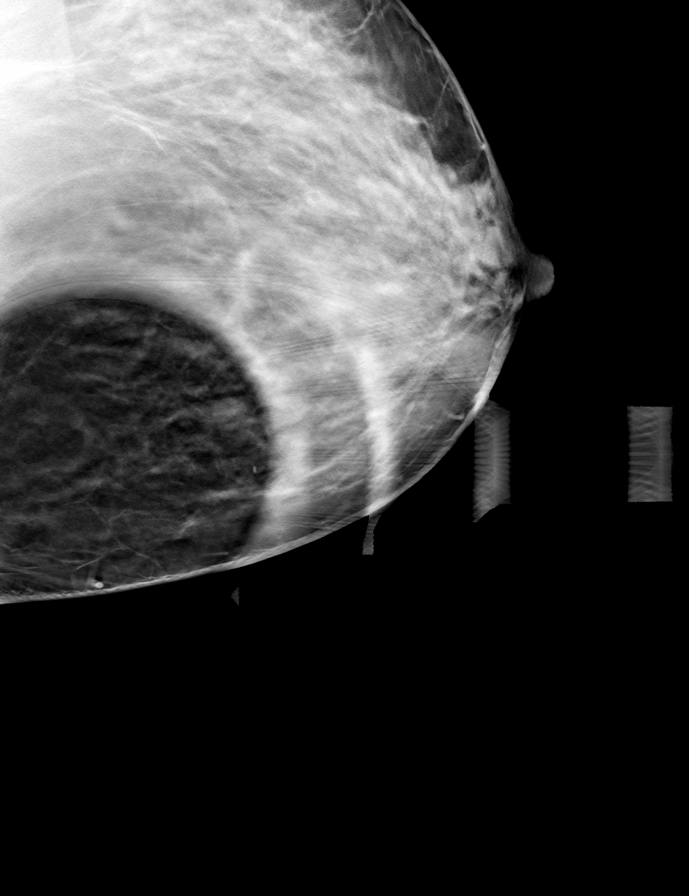

[R MLO tomo · tomo slice 25/50.0]
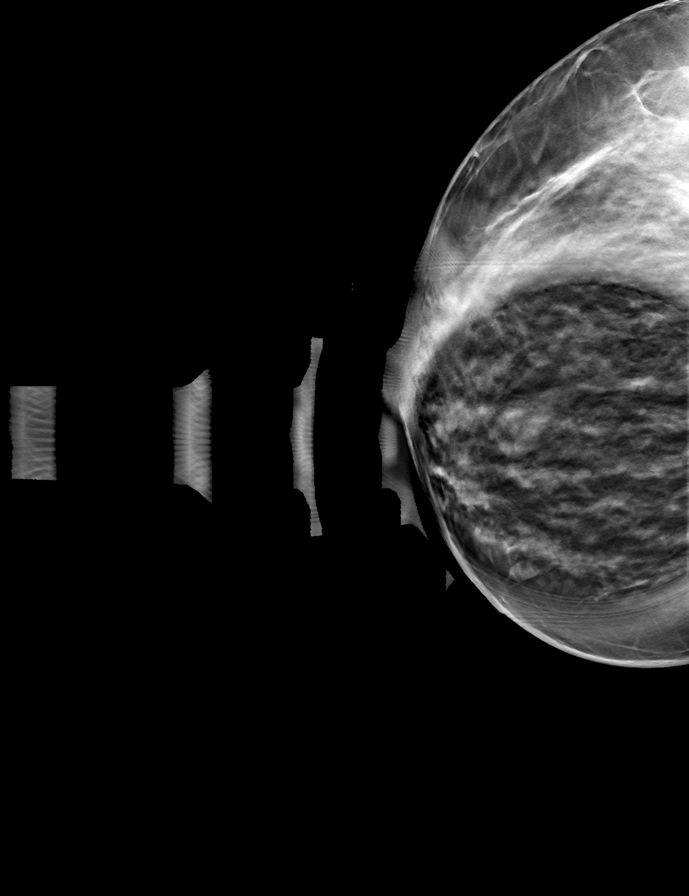

[L MLO tomo · tomo slice 26/51.0]
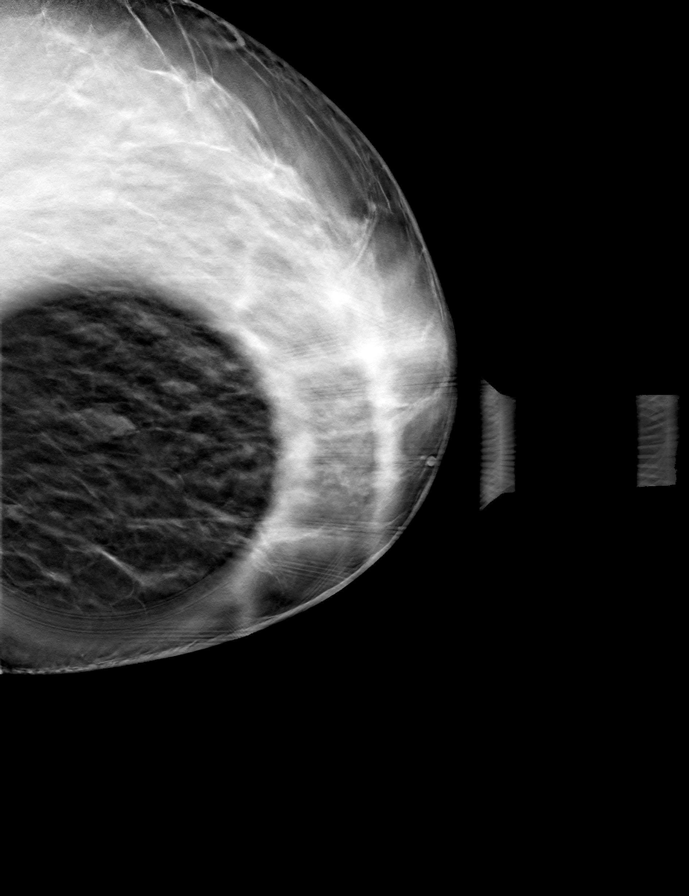

[8 of 24 positions shown; findings below may reference images not displayed]

ACR Breast Density Category c: The breast tissue is heterogeneously
dense, which may obscure small masses.
FINDINGS: On spot compression imaging, the possible masses noted on the
current screening study persist. On the right, there is a 1.5 cm
oval mostly circumscribed mass that projects at 6-7 o'clock. On the
left, there is an oval mass that lies medially, also proximally
cm in greatest dimension. There are no other defined masses, no
areas of architectural distortion and no suspicious calcifications.

Targeted right breast ultrasound is performed, showing an oval
hypoechoic, circumscribed and parallel mass at 6:30 o'clock,
measuring 1.6 x 0.8 x 1.5 cm, consistent in size, shape and location
to the mammographic mass. A second small masses seen adjacent to
this, measuring 4 mm.

Targeted left breast ultrasound is performed, showing an oval
hypoechoic mass with cystic spaces, at 9 o'clock, 2 cm the nipple,
measuring 1.2 x 0.4 x 1.1 cm. There 2 small cysts also noted that
lie at 6:30 o'clock, 2 cm the nipple.
IMPRESSION: 1. Probably benign masses in each breast, on the right a 1.6 cm mass
consistent with a fibroadenoma within the adjacent 4 mm mass that
may reflect a complicated cyst or lymph node. On the left, there is
a 1.2 cm mass that may reflect a fibroadenoma or fibrocystic lesion.
Two small benign cysts are noted at 6:30 o'clock.

RECOMMENDATION:
1. Short-term follow-up with bilateral breast ultrasound in 6 months
to reassess the 2 masses on the right at 6:30 o'clock and the a
single mass on the left at 9 o'clock.

I have discussed the findings and recommendations with the patient.
If applicable, a reminder letter will be sent to the patient
regarding the next appointment.

BI-RADS CATEGORY  3: Probably benign.

## 2023-11-24 ENCOUNTER — Ambulatory Visit (INDEPENDENT_AMBULATORY_CARE_PROVIDER_SITE_OTHER): Payer: Managed Care, Other (non HMO) | Admitting: Medical

## 2023-11-24 ENCOUNTER — Encounter: Payer: Self-pay | Admitting: Medical

## 2023-11-24 VITALS — BP 137/84 | HR 77 | Resp 18 | Ht 62.0 in | Wt 173.8 lb

## 2023-11-24 DIAGNOSIS — F419 Anxiety disorder, unspecified: Secondary | ICD-10-CM | POA: Diagnosis not present

## 2023-11-24 DIAGNOSIS — G4700 Insomnia, unspecified: Secondary | ICD-10-CM | POA: Diagnosis not present

## 2023-11-24 DIAGNOSIS — Z1211 Encounter for screening for malignant neoplasm of colon: Secondary | ICD-10-CM | POA: Diagnosis not present

## 2023-11-24 DIAGNOSIS — Z124 Encounter for screening for malignant neoplasm of cervix: Secondary | ICD-10-CM

## 2023-11-24 DIAGNOSIS — R5383 Other fatigue: Secondary | ICD-10-CM

## 2023-11-24 DIAGNOSIS — F329 Major depressive disorder, single episode, unspecified: Secondary | ICD-10-CM | POA: Diagnosis not present

## 2023-11-24 MED ORDER — SERTRALINE HCL 25 MG PO TABS: 25.0000 mg | ORAL_TABLET | Freq: Every day | ORAL | 0 refills | Status: DC

## 2023-11-24 NOTE — Progress Notes (Signed)
 Subjective:    Patient ID: Holly Barnett, female    DOB: 07-21-1976, 48 y.o.   MRN: [id number]  HPI  Discussed the use of AI scribe software for clinical note transcription with the patient, who gave verbal consent to proceed.  History of Present Illness   Holly Barnett is a 48 year old female who presents with depression and anxiety following the recent death of her mother.  She has been experiencing depression and anxiety since her mother's death on Nov 14, 2023. Her mother was diagnosed with cancer in October 2024, with masses in the colon and kidney, and later in three other organs. Her mother chose not to undergo surgery and passed away after a period of weight loss and declining health. Since her mother's passing, she has had trouble sleeping, irritability, and a lack of interest or care about things. She feels apathetic and not worried about anything specific. No thoughts of self-harm or harm to others.  She has experienced a loss of appetite, contributing to a weight loss of approximately 10 pounds over the past two months. Her appetite decreased significantly after her mother's illness worsened.  She has been unable to return to work since her mother's death, having used bereavement leave from January 27 to November 17, 2023, and has been on FMLA leave starting November 20, 2023. 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 She does not smoke.           Review of Systems  Constitutional:  Positive for fatigue and unexpected weight change. Negative for chills and fever.  Respiratory:  Negative for cough, choking and wheezing.   Gastrointestinal:  Negative for abdominal pain.  Genitourinary:  Negative for dysuria, flank pain, frequency and urgency.  Neurological:  Negative for dizziness, seizures and light-headedness.  Hematological:  Negative for adenopathy. Does not bruise/bleed easily.  Psychiatric/Behavioral:   Positive for dysphoric mood. Negative for suicidal ideas. The patient is nervous/anxious.     Past Medical History:  Diagnosis Date   Hypertension      Social History   Socioeconomic History   Marital status: Divorced    Spouse name: Not on file   Number of children: Not on file   Years of education: Not on file   Highest education level: Associate degree: occupational, scientist, product/process development, or vocational program  Occupational History   Occupation: cna  Tobacco Use   Smoking status: Never   Smokeless tobacco: Never  Substance and Sexual Activity   Alcohol use: Yes    Comment: occ   Drug use: No   Sexual activity: Not Currently    Birth control/protection: None  Other Topics Concern   Not on file  Social History Narrative   Not on file   Social Drivers of Health   Financial Resource Strain: Low Risk  (11/22/2023)   Overall Financial Resource Strain (CARDIA)    Difficulty of Paying Living Expenses: Not very hard  Food Insecurity: Food Insecurity Present (11/22/2023)   Hunger Vital Sign    Worried About Running Out of Food in the Last Year: Sometimes true    Ran Out of Food in the Last Year: Never true  Transportation Needs: No Transportation Needs (11/22/2023)   PRAPARE - Administrator, Civil Service (Medical): No    Lack of Transportation (Non-Medical): No  Physical Activity: Unknown (11/22/2023)   Exercise Vital Sign    Days of Exercise per Week: 0 days  Minutes of Exercise per Session: Not on file  Stress: Stress Concern Present (11/22/2023)   [employer] of Occupational Health - Occupational Stress Questionnaire    Feeling of Stress : Very much  Social Connections: Socially Isolated (11/22/2023)   Social Connection and Isolation Panel [NHANES]    Frequency of Communication with Friends and Family: Three times a week    Frequency of Social Gatherings with Friends and Family: Patient declined    Attends Religious Services: Never    Database Administrator or  Organizations: No    Attends Engineer, Structural: Not on file    Marital Status: Divorced  Catering Manager Violence: Not on file    Past Surgical History:  Procedure Laterality Date   BACK SURGERY     CHOLECYSTECTOMY      Family History  Problem Relation Age of Onset   Breast cancer Neg Hx     No Known Allergies  Current Outpatient Medications on File Prior to Visit  Medication Sig Dispense Refill   amLODipine  (NORVASC ) 10 MG tablet Take 1 tablet (10 mg total) by mouth daily. 90 tablet 3   chlorthalidone  (HYGROTON ) 25 MG tablet Take 1 tablet (25 mg total) by mouth daily. 90 tablet 3   No current facility-administered medications on file prior to visit.    BP 137/84   Pulse 77   Resp 18   Ht 5' 2 (1.575 m)   Wt 173 lb 12.8 oz (78.8 kg)   SpO2 100%   BMI 31.79 kg/m          Objective:   Physical Exam  General Mental Status- Alert. General Appearance- Not in acute distress.   Skin General: Color- Normal Color. Moisture- Normal Moisture.  Neck Carotid Arteries- Normal color. Moisture- Normal Moisture. No carotid bruits. No JVD.  Chest and Lung Exam Auscultation: Breath Sounds:-Normal.  Cardiovascular Auscultation:Rythm- Regular. Murmurs & Other Heart Sounds:Auscultation of the heart reveals- No Murmurs.  Abdomen Inspection:-Inspeection Normal. Palpation/Percussion:Note:No mass. Palpation and Percussion of the abdomen reveal- Non Tender, Non Distended + BS, no rebound or guarding.   Neurologic Cranial Nerve exam:- CN III-XII intact(No nystagmus), symmetric smile. Strength:- 5/5 equal and symmetric strength both upper and lower extremities.       Assessment & Plan:   Assessment and Plan     Patient Instructions  Reactive  depression and anxiety Recent bereavement with significant symptoms of depression and anxiety. -Start Sertraline  25mg  daily for depression and anxiety. -Encourage patient to increase food intake to regain lost  weight.  Unintentional Weight Loss with some fatigue 10 lbs weight loss over 2 months, likely secondary to decreased appetite from depression. No history of smoking or other risk factors for malignancy. -Order CBC, metabolic panel, and thyroid function tests to evaluate further -Encourage patient to increase food intake to regain lost weight.  Colon Cancer Screening Family history of colon cancer in mother. -Refer to GI for colonoscopy.  Cervical Cancer Screening Pap smear due. -Refer to OB/GYN for Pap smear.  FMLA Paperwork Patient has missed work due to bereavement and ongoing depressive symptoms. -Complete FMLA paperwork on follow up after assessing response to treatment as we discussed.  Follow-up 2 weeks or sooner if needed   Time spent with patient today was  41 minutes which consisted of chart revdew, discussing diagnosis, work up treatment and documentation.

## 2023-11-24 NOTE — Patient Instructions (Addendum)
 Reactive  depression and anxiety Recent bereavement with significant symptoms of depression and anxiety. -Start Sertraline  25mg  daily for depression and anxiety. -Encourage patient to increase food intake to regain lost weight.  Unintentional Weight Loss with some fatigue 10 lbs weight loss over 2 months, likely secondary to decreased appetite from depression. No history of smoking or other risk factors for malignancy. -Order CBC, metabolic panel, and thyroid function tests to evaluate further -Encourage patient to increase food intake to regain lost weight.  Colon Cancer Screening Family history of colon cancer in mother. -Refer to GI for colonoscopy.  Cervical Cancer Screening Pap smear due. -Refer to OB/GYN for Pap smear.  FMLA Paperwork Patient has missed work due to bereavement and ongoing depressive symptoms. -Complete FMLA paperwork on follow up after assessing response to treatment as we discussed.  Follow-up 2 weeks or sooner if needed

## 2023-11-25 ENCOUNTER — Encounter: Payer: Self-pay | Admitting: Medical

## 2023-11-25 LAB — COMPREHENSIVE METABOLIC PANEL
AG Ratio: 1.7 (calc) (ref 1.0–2.5)
ALT: 16 U/L (ref 6–29)
AST: 15 U/L (ref 10–35)
Albumin: 4.6 g/dL (ref 3.6–5.1)
Alkaline phosphatase (APISO): 86 U/L (ref 31–125)
BUN: 8 mg/dL (ref 7–25)
CO2: 28 mmol/L (ref 20–32)
Calcium: 10.3 mg/dL — ABNORMAL HIGH (ref 8.6–10.2)
Chloride: 104 mmol/L (ref 98–110)
Creat: 0.83 mg/dL (ref 0.50–0.99)
Globulin: 2.7 g/dL (ref 1.9–3.7)
Glucose, Bld: 119 mg/dL — ABNORMAL HIGH (ref 65–99)
Potassium: 4.2 mmol/L (ref 3.5–5.3)
Sodium: 142 mmol/L (ref 135–146)
Total Bilirubin: 0.3 mg/dL (ref 0.2–1.2)
Total Protein: 7.3 g/dL (ref 6.1–8.1)

## 2023-11-25 LAB — CBC WITH DIFFERENTIAL/PLATELET
Absolute Lymphocytes: 1845 {cells}/uL (ref 850–3900)
Absolute Monocytes: 320 {cells}/uL (ref 200–950)
Basophils Absolute: 20 {cells}/uL (ref 0–200)
Basophils Relative: 0.4 %
Eosinophils Absolute: 40 {cells}/uL (ref 15–500)
Eosinophils Relative: 0.8 %
HCT: 42 % (ref 35.0–45.0)
Hemoglobin: 14.4 g/dL (ref 11.7–15.5)
MCH: 31.3 pg (ref 27.0–33.0)
MCHC: 34.3 g/dL (ref 32.0–36.0)
MCV: 91.3 fL (ref 80.0–100.0)
MPV: 10.9 fL (ref 7.5–12.5)
Monocytes Relative: 6.4 %
Neutro Abs: 2775 {cells}/uL (ref 1500–7800)
Neutrophils Relative %: 55.5 %
Platelets: 267 10*3/uL (ref 140–400)
RBC: 4.6 10*6/uL (ref 3.80–5.10)
RDW: 12.8 % (ref 11.0–15.0)
Total Lymphocyte: 36.9 %
WBC: 5 10*3/uL (ref 3.8–10.8)

## 2023-11-25 LAB — T4, FREE: Free T4: 1.3 ng/dL (ref 0.8–1.8)

## 2023-11-25 LAB — TSH: TSH: 1.29 m[IU]/L

## 2023-12-05 ENCOUNTER — Telehealth: Payer: Self-pay

## 2023-12-05 NOTE — Telephone Encounter (Signed)
Received referral from patient's primary care office to reschedule annual. Left voicemail for patient to call back.

## 2023-12-08 ENCOUNTER — Ambulatory Visit: Payer: Managed Care, Other (non HMO) | Admitting: Medical

## 2023-12-11 ENCOUNTER — Ambulatory Visit: Payer: Managed Care, Other (non HMO) | Admitting: Medical

## 2023-12-11 VITALS — BP 128/86 | HR 94 | Temp 98.0°F | Resp 18 | Ht 62.0 in | Wt 175.6 lb

## 2023-12-11 DIAGNOSIS — G4700 Insomnia, unspecified: Secondary | ICD-10-CM | POA: Diagnosis not present

## 2023-12-11 DIAGNOSIS — F419 Anxiety disorder, unspecified: Secondary | ICD-10-CM | POA: Diagnosis not present

## 2023-12-11 DIAGNOSIS — F329 Major depressive disorder, single episode, unspecified: Secondary | ICD-10-CM

## 2023-12-11 MED ORDER — TRAZODONE HCL 50 MG PO TABS: 25.0000 mg | ORAL_TABLET | Freq: Every evening | ORAL | 3 refills | Status: AC | PRN

## 2023-12-11 MED ORDER — SERTRALINE HCL 25 MG PO TABS: 25.0000 mg | ORAL_TABLET | Freq: Every day | ORAL | 0 refills | Status: DC

## 2023-12-11 NOTE — Patient Instructions (Addendum)
 Depression and Anxiety Persistent symptoms despite Sertraline 25mg . Patient reports insomnia, fatigue, and lack of motivation. Possible side effect of Sertraline causing insomnia. Discussed potential switch to Effexor for dual norepinephrine and serotonin reuptake inhibition, and potential for increased energy. -Stop Sertraline for 2 days to assess for improvement in insomnia. -Start Trazodone 50mg  at night for insomnia, and adjust timing to align with normal sleep cycle. -Consider restarting Sertraline or switching to Effexor based on response to Trazodone and cessation of Sertraline. -Consider counseling for additional support and management of depression and anxiety. -serotonin syndrome education since concern for med side effects.  Follow-up -Schedule video visit for Friday, 12/15/2023 at 2pm to assess response to Trazodone and discuss further management/fill out form  Serotonin Syndrome Serotonin is a chemical that helps to control several functions in the body. This chemical is also called a neurotransmitter. It controls: Brain and nerve cell function. Mood and emotions. Memory. Eating. Sleeping. Sexual activity. Stress response. Having too much serotonin in your body can cause serotonin syndrome. This condition can be harmful to your brain and nerve cells. This can be a life-threatening condition. What are the causes? This condition may be caused by taking medicines or drugs that increase the level of serotonin in your body, such as: Antidepressant medicines. Migraine medicines. Certain pain medicines. Certain drugs, including ecstasy, LSD, cocaine, and amphetamines. Over-the-counter cough or cold medicines that contain dextromethorphan. Certain herbal supplements, including St. John's wort, ginseng, and nutmeg. This condition usually occurs when you take these medicines or drugs together, but it can also happen with a high dose of a single medicine or drug. What increases the  risk? You are more likely to develop this condition if: You just started taking a medicine or drug that increases the level of serotonin in the body. You recently increased the dose of a medicine or drug that increases the level of serotonin in the body. You take more than one medicine or drug that increases the level of serotonin in the body. What are the signs or symptoms? Symptoms of this condition usually start within several hours of taking a medicine or drug. Symptoms may be mild or severe. Mild symptoms include: Sweating. Restlessness or agitation. Muscle twitching or stiffness. Rapid heart rate. Nausea, vomiting, or diarrhea. Shivering or goose bumps. Confusion. Severe symptoms include: Irregular heartbeat. Seizures. Loss of consciousness. High fever. How is this diagnosed? This condition may be diagnosed based on: Your medical history. A physical exam. Your prior use of drugs and medicines. Blood or urine tests. These may be used to rule out other causes of your symptoms. How is this treated? The treatment for this condition depends on the severity of your symptoms. For mild cases, stopping the medicine or drug that caused your condition is usually all that is needed. For moderate to severe cases, treatment in a hospital may be needed to prevent or treat life-threatening symptoms. Treatment may include: Medicines to control your symptoms. IV fluids. Actions to support your breathing. Treatments to control your body temperature. Follow these instructions at home: Medicines  Take over-the-counter and prescription medicines only as told by your health care provider. Check with your health care provider before you start taking any new prescriptions, over-the-counter medicines, herbs, or supplements. Do not combine any medicines that can cause this condition. Lifestyle  Maintain a healthy lifestyle. Eat a healthy diet that includes plenty of vegetables, fruits, whole  grains, low-fat dairy products, and lean protein. Do not eat a lot of foods that are  high in fat, added sugars, or salt. Get the right amount and quality of sleep. Most adults need 7-9 hours of sleep each night. Make time to exercise, even if it is only for short periods of time. Most adults should exercise for at least 150 minutes each week. Do not drink alcohol. Do not use illegal drugs. Do not take medicines for reasons other than they are prescribed. General instructions Do not use any products that contain nicotine or tobacco. These products include cigarettes, chewing tobacco, and vaping devices, such as e-cigarettes. If you need help quitting, ask your health care provider. Contact a health care provider if: Your symptoms do not improve or they get worse. Get help right away if: You have worsening confusion, severe headache, chest pain, high fever, seizures, or loss of consciousness. You experience serious side effects of medicine, such as swelling of your face, lips, tongue, or throat. These symptoms may be an emergency. Get help right away. Call 911. Do not wait to see if the symptoms will go away. Do not drive yourself to the hospital. Also, get help right away if: You have serious thoughts about hurting yourself or others. Take one of these steps if you feel like you may hurt yourself or others, or have thoughts about taking your own life: Go to your nearest emergency room. Call 911. Call the National Suicide Prevention Lifeline at 715-360-9484 or 988. This is open 24 hours a day. Text the Crisis Text Line at (848)829-6797. Summary Serotonin is a chemical that helps to control several functions in the body. High levels of serotonin in the body can cause serotonin syndrome, which can be life-threatening. This condition may be caused by taking medicines or drugs that increase the level of serotonin in your body. Treatment depends on the severity of your symptoms. For mild cases, stopping  the medicine or drug that caused your condition is usually all that is needed. Check with your health care provider before you start taking any new prescriptions, over-the-counter medicines, herbs, or supplements. This information is not intended to replace advice given to you by your health care provider. Make sure you discuss any questions you have with your health care provider. Document Revised: 12/23/2021 Document Reviewed: 12/23/2021 Elsevier Patient Education  2024 [employer].

## 2023-12-11 NOTE — Progress Notes (Signed)
 Subjective:    Patient ID: Holly Barnett, female    DOB: 10-04-1976, 48 y.o.   MRN: [id number]  HPI Discussed the use of AI scribe software for clinical note transcription with the patient, who gave verbal consent to proceed.  History of Present Illness   Holly Barnett is a 48 year old female with depression and anxiety who presents with ongoing symptoms despite medication use.  She has been experiencing persistent depression and anxiety since the passing of her mother, a significant emotional event. Despite starting sertraline 25 mg, her symptoms have remained unchanged.  She describes significant insomnia, stating she can barely sleep and often stays awake until the early morning hours, sometimes falling asleep between 6 and 8 AM. She attributes her fatigue to this disrupted sleep pattern. Her insomnia began after starting sertraline, although she acknowledges that she had to adjust her sleep schedule to care for her mother before her passing. She typically sleeps from 8 AM to 11 AM, which is not her normal sleep cycle, as she used to sleep from around 10 PM to 12 AM before her mother's illness.  She experiences nausea as a side effect of sertraline and reports significant fatigue and a lack of motivation, stating, 'I just don't have the will to want to do anything.'  She is currently taking sertraline 25 mg.           Review of Systems  Constitutional:  Negative for chills, fatigue and fever.  Respiratory:  Negative for cough, chest tightness, shortness of breath and wheezing.   Gastrointestinal:  Negative for constipation, diarrhea and nausea.  Genitourinary:  Negative for dysuria.  Musculoskeletal:  Negative for back pain and joint swelling.  Skin:  Negative for rash.  Neurological:  Negative for dizziness, speech difficulty, numbness and headaches.  Hematological:  Negative for adenopathy. Does not bruise/bleed easily.  Psychiatric/Behavioral:  Negative for behavioral problems  and decreased concentration.     Past Medical History:  Diagnosis Date   Hypertension      Social History   Socioeconomic History   Marital status: Divorced    Spouse name: Not on file   Number of children: Not on file   Years of education: Not on file   Highest education level: Associate degree: occupational, Scientist, product/process development, or vocational program  Occupational History   Occupation: cna  Tobacco Use   Smoking status: Never   Smokeless tobacco: Never  Substance and Sexual Activity   Alcohol use: Yes    Comment: occ   Drug use: No   Sexual activity: Not Currently    Birth control/protection: None  Other Topics Concern   Not on file  Social History Narrative   Not on file   Social Drivers of Health   Financial Resource Strain: Low Risk  (11/22/2023)   Overall Financial Resource Strain (CARDIA)    Difficulty of Paying Living Expenses: Not very hard  Food Insecurity: Food Insecurity Present (11/22/2023)   Hunger Vital Sign    Worried About Running Out of Food in the Last Year: Sometimes true    Ran Out of Food in the Last Year: Never true  Transportation Needs: No Transportation Needs (11/22/2023)   PRAPARE - Administrator, Civil Service (Medical): No    Lack of Transportation (Non-Medical): No  Physical Activity: Unknown (11/22/2023)   Exercise Vital Sign    Days of Exercise per Week: 0 days    Minutes of Exercise per Session: Not on file  Stress: Stress  Concern Present (11/22/2023)   Holly Barnett    Feeling of Stress : Very much  Social Connections: Socially Isolated (11/22/2023)   Social Connection and Isolation Panel [NHANES]    Frequency of Communication with Friends and Family: Three times a week    Frequency of Social Gatherings with Friends and Family: Patient declined    Attends Religious Services: Never    Database administrator or Organizations: No    Attends Engineer, structural: Not  on file    Marital Status: Divorced  Catering manager Violence: Not on file    Past Surgical History:  Procedure Laterality Date   BACK SURGERY     CHOLECYSTECTOMY      Family History  Problem Relation Age of Onset   Breast cancer Neg Hx     No Known Allergies  Current Outpatient Medications on File Prior to Visit  Medication Sig Dispense Refill   amLODipine (NORVASC) 10 MG tablet Take 1 tablet (10 mg total) by mouth daily. 90 tablet 3   chlorthalidone (HYGROTON) 25 MG tablet Take 1 tablet (25 mg total) by mouth daily. 90 tablet 3   sertraline (ZOLOFT) 25 MG tablet Take 1 tablet (25 mg total) by mouth daily. 30 tablet 0   No current facility-administered medications on file prior to visit.    BP 128/86   Pulse 94   Temp 98 F (36.7 C)   Resp 18   Ht 5\' 2"  (1.575 m)   Wt 175 lb 9.6 oz (79.7 kg)   SpO2 98%   BMI 32.12 kg/m        Objective:   Physical Exam  General Mental Status- Alert. General Appearance- Not in acute distress.   Skin General: Color- Normal Color. Moisture- Normal Moisture.  Neck Carotid Arteries- Normal color. Moisture- Normal Moisture. No carotid bruits. No JVD.  Chest and Lung Exam Auscultation: Breath Sounds:-CTA  Cardiovascular Auscultation:Rythm- RRR Murmurs & Other Heart Sounds:Auscultation of the heart reveals- No Murmurs.  Abdomen Inspection:-Inspeection Normal. Palpation/Percussion:Note:No mass. Palpation and Percussion of the abdomen reveal- Non Tender, Non Distended + BS, no rebound or guarding.   Neurologic Cranial Nerve exam:- CN III-XII intact(No nystagmus), symmetric smile. Strength:- 5/5 equal and symmetric strength both upper and lower extremities.       Assessment & Plan:   Assessment and Plan    Depression and Anxiety Persistent symptoms despite Sertraline 25mg . Patient reports insomnia, fatigue, and lack of motivation. Possible side effect of Sertraline causing insomnia per pt - Discussed potential  switch to Effexor for dual norepinephrine and serotonin reuptake inhibition, and potential for increased energy. -Stop Sertraline for 2 days to assess for improvement in insomnia. -Start Trazodone 50mg  at night for insomnia, and adjust timing to align with normal sleep cycle. -Consider restarting Sertraline or switching to Effexor based on response to Trazodone and cessation of Sertraline. -Consider counseling for additional support and management of depression and anxiety. Holly Barnett counselor information. if she calls and starts to get scheuled then can place referral once she update me on location and number.  -serotonin syndrome education since concern for med side effects.  Follow-up -Schedule video visit for Friday, 12/15/2023 at 2pm to assess response to Trazodone and discuss further management/fill out form

## 2023-12-15 ENCOUNTER — Telehealth: Payer: Managed Care, Other (non HMO) | Admitting: Medical

## 2023-12-15 DIAGNOSIS — F419 Anxiety disorder, unspecified: Secondary | ICD-10-CM | POA: Diagnosis not present

## 2023-12-15 DIAGNOSIS — F329 Major depressive disorder, single episode, unspecified: Secondary | ICD-10-CM | POA: Diagnosis not present

## 2023-12-15 DIAGNOSIS — G4700 Insomnia, unspecified: Secondary | ICD-10-CM | POA: Diagnosis not present

## 2023-12-15 NOTE — Progress Notes (Signed)
 Virtual Visit via Video Note  I connected with Holly Barnett on 12/15/23 at  2:00 PM EST by a video enabled telemedicine application and verified that I am speaking with the correct person using two identifiers.  Location: Patient: car in [place]. Provider: office. [place]   I discussed the limitations of evaluation and management by telemedicine and the availability of in person appointments. The patient expressed understanding and agreed to proceed.  History of Present Illness: Discussed the use of AI scribe software for clinical note transcription with the patient, who gave verbal consent to proceed.  History of Present Illness         Discussed the use of AI scribe software for clinical note transcription with the patient, who gave verbal consent to proceed.  History of Present Illness   Holly Barnett is a 48 year old female with reactive depression and anxiety who presents with a request for extension of medical leave.  She has been experiencing reactive depression and anxiety following the death of her mother, which has significantly impacted her ability to function. She has been on medical leave since November 20, 2023, and is unable to return to work until she receives a doctor's release. She is requesting an extension of her medical leave until January 08, 2024.  Initially, she was prescribed sertraline to manage her mood, but she discontinued it due to suspected insomnia and lack of improvement in her symptoms. Despite stopping sertraline, she continues to experience insomnia.  She requires documentation for work leave due to her medical condition.   Pt will start trazadone for insomnia. Then after 2 weeks may add on effexor for mood and anxiety. Will first see if can help insomnia which can be severe.      No thoughts of harm to self or others.   Observations/Objective: General-no acute distress, pleasant, oriented. Lungs- on inspection lungs appear unlabored. Neck- no tracheal  deviation or jvd on inspection. Neuro- gross motor function appears intact.  Assessment and Plan: Assessment and Plan          Assessment and Plan    Reactive Depression and Anxiety Patient is currently on leave from work due to symptoms. Sertraline was discontinued due to potential insomnia side effects and lack of improvement in mood and anxiety. Trazodone was prescribed for insomnia but has not yet been started by the patient. -Start Trazodone 25-50mg  at night for insomnia. -Update provider in one week regarding sleep and mood. -Consider adding Effexor if mood and anxiety.  Work Leave Patient is currently on leave from work due to mental health symptoms. The leave started on February 3rd, 2025 and is ongoing. The employer requires a filled out disability leave provider statement form. -Extend estimated return to work date to March 24th, 2025 on the disability leave provider statement form. -Schedule follow-up appointment for March 20th, 2025. -Fax completed form to employer. -Provide additional letter to employer via MyChart on Monday if needed.        Esperanza Richters, PA-C     Follow Up Instructions:    I discussed the assessment and treatment plan with the patient. The patient was provided an opportunity to ask questions and all were answered. The patient agreed with the plan and demonstrated an understanding of the instructions.   The patient was advised to call back or seek an in-person evaluation if the symptoms worsen or if the condition fails to improve as anticipated.   Esperanza Richters, PA-C  Time spent with patient today was 59  minutes which consisted of chart revdiew, discussing diagnosis, work up treatment and documentation.(Total time discussing health condition and filling out complex form today)

## 2023-12-15 NOTE — Patient Instructions (Signed)
 Reactive Depression and Anxiety Patient is currently on leave from work due to symptoms. Sertraline was discontinued due to potential insomnia side effects and lack of improvement in mood and anxiety. Trazodone was prescribed for insomnia but has not yet been started by the patient. -Start Trazodone 25-50mg  at night for insomnia. -Update provider in one week regarding sleep and mood. -Consider adding Effexor if mood and anxiety.  Work Leave Patient is currently on leave from work due to mental health symptoms. The leave started on February 3rd, 2025 and is ongoing. The employer requires a filled out disability leave provider statement form. -Extend estimated return to work date to March 24th, 2025 on the disability leave provider statement form. -Schedule follow-up appointment for March 20th, 2025. -Fax completed form to employer. -Provide additional letter to employer via MyChart on Monday if needed.

## 2023-12-19 ENCOUNTER — Telehealth: Payer: Self-pay

## 2023-12-19 NOTE — Telephone Encounter (Signed)
 Copied from CRM [id number]. Topic: General - Other >> Dec 19, 2023 11:04 AM Kathryne Eriksson wrote: Reason for CRM: Requesting Short Term Disability Form >> Dec 19, 2023 11:08 AM Kathryne Eriksson wrote: Patient states she's wanting to come into the office to pick up her short term disability forms that was completed by her Saguier, Ramon Dredge, PA-C. Patient is requesting a call back as she is requesting a copy. Patient's call back number is [phone] .

## 2023-12-20 NOTE — Telephone Encounter (Signed)
 Pt requested email and copy placed up front  Emailed to [email or web address]

## 2024-01-04 ENCOUNTER — Ambulatory Visit: Admitting: Medical

## 2024-01-05 ENCOUNTER — Encounter: Payer: Self-pay | Admitting: Medical

## 2024-01-05 ENCOUNTER — Ambulatory Visit: Admitting: Medical

## 2024-01-05 VITALS — BP 130/84 | HR 88 | Resp 18 | Ht 62.0 in | Wt 175.0 lb

## 2024-01-05 DIAGNOSIS — F419 Anxiety disorder, unspecified: Secondary | ICD-10-CM

## 2024-01-05 DIAGNOSIS — F329 Major depressive disorder, single episode, unspecified: Secondary | ICD-10-CM | POA: Diagnosis not present

## 2024-01-05 NOTE — Patient Instructions (Addendum)
 Depression and Anxiety Experiencing ongoing depression and anxiety affecting work performance/pt expresses desire to extend short term disability. Trazodone aids sleep. Counseled on restarting sertraline recommended. Explained insomnia not common side effect that I see with sertraline. Can restart now. - Restart sertraline at 25 mg, monitor for insomnia. Would not expect this side effect but notify if this were to occur. - Continue trazodone for sleep. - Refer to counselor and psychiatrist. Explained importance at this point. - Extend short-term disability until Feb 15, 2024. Will fill out form next week. Gave letter today - Follow up on February 08, 2024, to reassess symptoms and work readiness.  Insomnia Trazodone effective for insomnia. Insomnia not definitively linked to sertraline. - Continue trazodone for sleep. - Restart sertraline at 25 mg, monitor for sleep impact.   Follow up 4-24, 2025 or sooner if needed

## 2024-01-05 NOTE — Progress Notes (Addendum)
 Subjective:    Patient ID: Holly Barnett, female    DOB: [birth year], 48 y.o.   MRN: [id number]  HPI Discussed the use of AI scribe software for clinical note transcription with the patient, who gave verbal consent to proceed.  History of Present Illness   Holly Barnett is a 48 year old female who presents for follow-up regarding her mood and short-term disability status.  She has been experiencing depression and anxiety, which she associates with the recent passing of her mother. Her mother was diagnosed with cancer in 08-23-24 and passed away in 24-Nov-2023, which was sudden and unexpected. She has been on short-term disability since November 20, 2023, and was initially expected to return to work on January 08, 2024, but she does not feel ready to return due to ongoing symptoms.  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 She noted a decline in her work metrics, with issues in quality and forgetting steps before starting disability. Despite these challenges, she maintains her composure during stressful interactions with customers, though she feels more frustrated and sad.  Regarding her sleep, she has been taking trazodone, which has helped her sleep without side effects. She was previously prescribed sertraline but stopped it due to concerns about insomnia, which she associated with its use. She has not experienced any other side effects from sertraline and has not resumed it since starting trazodone.  She has not yet engaged with a behavioral health specialist or counselor, despite recognizing the potential benefit of such support. I had given info on counselor and psychiatrist. Advised to contact but she did not follow through.        Review of Systems  Constitutional:  Negative for chills, fatigue and fever.  HENT:  Negative for congestion.   Respiratory:   Negative for cough, chest tightness and wheezing.   Cardiovascular:  Negative for chest pain and palpitations.  Gastrointestinal:  Negative for abdominal pain.  Genitourinary:  Negative for dysuria and flank pain.  Musculoskeletal:  Negative for back pain.  Neurological:  Negative for dizziness and numbness.  Hematological:  Negative for adenopathy.  Psychiatric/Behavioral:  Positive for dysphoric mood and sleep disturbance. Negative for behavioral problems and suicidal ideas. The patient is nervous/anxious.     Past Medical History:  Diagnosis Date   Hypertension      Social History   Socioeconomic History   Marital status: Divorced    Spouse name: Not on file   Number of children: Not on file   Years of education: Not on file   Highest education level: Associate degree: occupational, Scientist, product/process development, or vocational program  Occupational History   Occupation: cna  Tobacco Use   Smoking status: Never   Smokeless tobacco: Never  Substance and Sexual Activity   Alcohol use: Yes    Comment: occ   Drug use: No   Sexual activity: Not Currently    Birth control/protection: None  Other Topics Concern   Not on file  Social History Narrative   Not on file   Social Drivers of Health   Financial Resource Strain: Low Risk  (11/22/2023)   Overall Financial Resource Strain (CARDIA)    Difficulty of Paying Living Expenses: Not very hard  Food Insecurity: Food Insecurity Present (11/22/2023)   Hunger Vital Sign    Worried About Running Out of Food in the Last Year: Sometimes true    Ran Out of  Food in the Last Year: Never true  Transportation Needs: No Transportation Needs (11/22/2023)   PRAPARE - Administrator, Civil Service (Medical): No    Lack of Transportation (Non-Medical): No  Physical Activity: Unknown (11/22/2023)   Exercise Vital Sign    Days of Exercise per Week: 0 days    Minutes of Exercise per Session: Not on file  Stress: Stress Concern Present (11/22/2023)    [employer] of Occupational Health - Occupational Stress Questionnaire    Feeling of Stress : Very much  Social Connections: Socially Isolated (11/22/2023)   Social Connection and Isolation Panel [NHANES]    Frequency of Communication with Friends and Family: Three times a week    Frequency of Social Gatherings with Friends and Family: Patient declined    Attends Religious Services: Never    Database administrator or Organizations: No    Attends Engineer, structural: Not on file    Marital Status: Divorced  Catering manager Violence: Not on file    Past Surgical History:  Procedure Laterality Date   BACK SURGERY     CHOLECYSTECTOMY      Family History  Problem Relation Age of Onset   Breast cancer Neg Hx     No Known Allergies  Current Outpatient Medications on File Prior to Visit  Medication Sig Dispense Refill   amLODipine (NORVASC) 10 MG tablet Take 1 tablet (10 mg total) by mouth daily. 90 tablet 3   chlorthalidone (HYGROTON) 25 MG tablet Take 1 tablet (25 mg total) by mouth daily. 90 tablet 3   traZODone (DESYREL) 50 MG tablet Take 0.5-1 tablets (25-50 mg total) by mouth at bedtime as needed for sleep. 30 tablet 3   No current facility-administered medications on file prior to visit.    BP 130/84   Pulse 88   Resp 18   Ht 5\' 2"  (1.575 m)   Wt 175 lb (79.4 kg)   SpO2 99%   BMI 32.01 kg/m        Objective:   Physical Exam  General Mental Status- Alert. General Appearance- Not in acute distress.     Neck Carotid Arteries- Normal color. Moisture- Normal Moisture. No carotid bruits. No JVD.  Chest and Lung Exam Auscultation: Breath Sounds:-CTA  Cardiovascular Auscultation:Rythm- RRR Murmurs & Other Heart Sounds:Auscultation of the heart reveals- No Murmurs.  Abdomen Inspection:-Inspeection Normal. Palpation/Percussion:Note:No mass. Palpation and Percussion of the abdomen reveal- Non Tender, Non Distended + BS, no rebound or  guarding.   Neurologic Cranial Nerve exam:- CN III-XII intact(No nystagmus), symmetric smile. Strength:- 5/5 equal and symmetric strength both upper and lower extremities.       Assessment & Plan:  Assessment and Plan    Depression and Anxiety Experiencing ongoing depression and anxiety affecting work performance/pt expresses desire to extend short term disability. Trazodone aids sleep. Counseled on restarting sertraline recommended. Explained insomnia not common side effect that I see with sertraline. Can restart now. - Restart sertraline at 25 mg, monitor for insomnia. Would not expect this side effect but notify if this were to occur. - Continue trazodone for sleep. - Refer to counselor and psychiatrist. Explained importance at this point. - Extend short-term disability until Feb 15, 2024. Will fill out form next week. Gave letter today - Follow up on February 08, 2024, to reassess symptoms and work readiness.  Insomnia Trazodone effective for insomnia. Insomnia not definitively linked to sertraline. - Continue trazodone for sleep. - Restart sertraline at 25 mg,  monitor for sleep impact.   Follow up 4-24, 2025 or sooner if needed   Esperanza Richters, PA-C        Time spent with patient today was 41  minutes which consisted of chart revdiew, discussing diagnoses, work up treatment and documentation. Extra time today going over disability form. Reviewing prior form extensive number of question to assess interval condition in order to fill out form.

## 2024-01-06 NOTE — Addendum Note (Signed)
 Addended by: Gwenevere Abbot on: 01/06/2024 08:26 AM   Modules accepted: Level of Service

## 2024-01-12 NOTE — Telephone Encounter (Signed)
 I just put pt most recent form on your keyboard. [employer] form behavioral health time form. Can you charge her. Filled out after hours on Friday afternoon. Go ahead and fax form

## 2024-01-17 ENCOUNTER — Encounter: Payer: Self-pay | Admitting: Medical

## 2024-02-12 ENCOUNTER — Ambulatory Visit: Admitting: Medical

## 2024-02-12 VITALS — BP 130/86 | HR 84 | Resp 18 | Ht 62.0 in | Wt 176.0 lb

## 2024-02-12 DIAGNOSIS — F329 Major depressive disorder, single episode, unspecified: Secondary | ICD-10-CM

## 2024-02-12 DIAGNOSIS — G4700 Insomnia, unspecified: Secondary | ICD-10-CM

## 2024-02-12 DIAGNOSIS — F419 Anxiety disorder, unspecified: Secondary | ICD-10-CM | POA: Diagnosis not present

## 2024-02-12 NOTE — Progress Notes (Signed)
 Subjective:    Patient ID: Holly Barnett, female    DOB: [birth year], 48 y.o.   MRN: [id number]  HPI   Holly Barnett is a 48 year old female with anxiety and depression who presents with worsening symptoms and inability to return to work.  She experiences significant anxiety and depression symptoms, with no improvement despite current treatment. She has gained weight, attributing it to increased sleeping and eating. She primarily sleeps, which she believes contributes to her weight gain.  She is currently taking trazodone , which aids her sleep, but her mood and anxiety remain unchanged. She previously tried sertraline  at 25 mg but discontinued it prematurely due to concerns about insomnia. She has not restarted sertraline .  She has been missing work due to her symptoms and does not feel ready to return. Her anxiety and depression scores have worsened, with a recent PHQ-9 score of 18 and a GAD-7 score of 6, indicating a lack of progress in managing her symptoms.          Pt in for follow up. Last hpi and AVS below I n". Last visit 01-05-2024. Her last phq-9 score was 18 and gad 7 was 6  Today 02-12-2024 phq-9 21 and gad 7 8  Hpi Holly Barnett is a 48 year old female who presents for follow-up regarding her mood and short-term disability status.   She has been experiencing depression and anxiety, which she associates with the recent passing of her mother. Her mother was diagnosed with cancer in 07-28-24 and passed away in 2023/10/29, which was sudden and unexpected. She has been on short-term disability since November 20, 2023, and was initially expected to return to work on January 08, 2024, but she does not feel ready to return due to ongoing symptoms.   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 She noted a decline in her work metrics, with issues in quality  and forgetting steps before starting disability. Despite these challenges, she maintains her composure during stressful interactions with customers, though she feels more frustrated and sad.   Regarding her sleep, she has been taking trazodone , which has helped her sleep without side effects. She was previously prescribed sertraline  but stopped it due to concerns about insomnia, which she associated with its use. She has not experienced any other side effects from sertraline  and has not resumed it since starting trazodone .   She has not yet engaged with a behavioral health specialist or counselor, despite recognizing the potential benefit of such support. I had given info on counselor and psychiatrist. Advised to contact but she did not follow through.    Last visit avs "Depression and Anxiety Experiencing ongoing depression and anxiety affecting work performance/pt expresses desire to extend short term disability. Trazodone  aids sleep. Counseled on restarting sertraline  recommended. Explained insomnia not common side effect that I see with sertraline . Can restart now. - Restart sertraline  at 25 mg, monitor for insomnia. Would not expect this side effect but notify if this were to occur. - Continue trazodone  for sleep. - Refer to counselor and psychiatrist. Explained importance at this point. - Extend short-term disability until Feb 15, 2024. Will fill out form next week. Gave letter today - Follow up on February 08, 2024, to reassess symptoms and work readiness.   Insomnia Trazodone  effective for insomnia. Insomnia not definitively linked to sertraline . - Continue trazodone  for sleep. - Restart sertraline  at 25  mg, monitor for sleep impact."   Review of Systems  Constitutional:  Negative for chills, fatigue and fever.  Respiratory:  Negative for cough, chest tightness and wheezing.   Cardiovascular:  Negative for chest pain and palpitations.  Gastrointestinal:  Negative for abdominal pain.   Neurological:  Negative for dizziness, speech difficulty, weakness and light-headedness.  Hematological:  Negative for adenopathy. Does not bruise/bleed easily.  Psychiatric/Behavioral:  Positive for dysphoric mood. Negative for behavioral problems and suicidal ideas. The patient is nervous/anxious.     Past Medical History:  Diagnosis Date   Hypertension      Social History   Socioeconomic History   Marital status: Divorced    Spouse name: Not on file   Number of children: Not on file   Years of education: Not on file   Highest education level: Associate degree: occupational, Scientist, product/process development, or vocational program  Occupational History   Occupation: cna  Tobacco Use   Smoking status: Never   Smokeless tobacco: Never  Substance and Sexual Activity   Alcohol use: Yes    Comment: occ   Drug use: No   Sexual activity: Not Currently    Birth control/protection: None  Other Topics Concern   Not on file  Social History Narrative   Not on file   Social Drivers of Health   Financial Resource Strain: Low Risk  (11/22/2023)   Overall Financial Resource Strain (CARDIA)    Difficulty of Paying Living Expenses: Not very hard  Food Insecurity: Food Insecurity Present (11/22/2023)   Hunger Vital Sign    Worried About Running Out of Food in the Last Year: Sometimes true    Ran Out of Food in the Last Year: Never true  Transportation Needs: No Transportation Needs (11/22/2023)   PRAPARE - Administrator, Civil Service (Medical): No    Lack of Transportation (Non-Medical): No  Physical Activity: Unknown (11/22/2023)   Exercise Vital Sign    Days of Exercise per Week: 0 days    Minutes of Exercise per Session: Not on file  Stress: Stress Concern Present (11/22/2023)   [employer] of Occupational Health - Occupational Stress Questionnaire    Feeling of Stress : Very much  Social Connections: Socially Isolated (11/22/2023)   Social Connection and Isolation Panel [NHANES]     Frequency of Communication with Friends and Family: Three times a week    Frequency of Social Gatherings with Friends and Family: Patient declined    Attends Religious Services: Never    Database administrator or Organizations: No    Attends Engineer, structural: Not on file    Marital Status: Divorced  Catering manager Violence: Not on file    Past Surgical History:  Procedure Laterality Date   BACK SURGERY     CHOLECYSTECTOMY      Family History  Problem Relation Age of Onset   Breast cancer Neg Hx     No Known Allergies  Current Outpatient Medications on File Prior to Visit  Medication Sig Dispense Refill   amLODipine  (NORVASC ) 10 MG tablet Take 1 tablet (10 mg total) by mouth daily. 90 tablet 3   chlorthalidone  (HYGROTON ) 25 MG tablet Take 1 tablet (25 mg total) by mouth daily. 90 tablet 3   traZODone  (DESYREL ) 50 MG tablet Take 0.5-1 tablets (25-50 mg total) by mouth at bedtime as needed for sleep. 30 tablet 3   No current facility-administered medications on file prior to visit.    BP 130/86  Pulse 84   Resp 18   Ht 5\' 2"  (1.575 m)   Wt 176 lb (79.8 kg)   SpO2 100%   BMI 32.19 kg/m        Objective:   Physical Exam  General- No acute distress. Pleasant patient. Neck- Full range of motion, no jvd Lungs- Clear, even and unlabored. Heart- regular rate and rhythm. Neurologic- CNII- XII grossly intact.       Assessment & Plan:   Patient Instructions  Anxiety and Depression Persistent anxiety and depression with worsening symptoms. PHQ-9 score of 21 and GAD-7 8 score of 6. Previous sertraline  trial incomplete. Trazodone  used for sleep. Not ready to return to work. - Restart sertraline  25 mg daily. Monitor for insomnia. - Continue trazodone  for sleep. - Refer to counselor and psychiatrist. Notify provider of chosen office for referral. - Provide work note indicating not ready to return to work. - Follow up in one month to assess response to  sertraline .  Insomnia Insomnia related to anxiety and depression. Managed with trazodone , effective for sleep. - Continue trazodone  for sleep.  Follow up one month or sooner if needed.   Kory Rains, PA-C

## 2024-02-12 NOTE — Patient Instructions (Signed)
 Anxiety and Depression Persistent anxiety and depression with worsening symptoms. PHQ-9 score of 21 and GAD-7 8 score of 6. Previous sertraline  trial incomplete. Trazodone  used for sleep. Not ready to return to work. - Restart sertraline  25 mg daily. Monitor for insomnia. - Continue trazodone  for sleep. - Refer to counselor and psychiatrist. Notify provider of chosen office for referral. - Provide work note indicating not ready to return to work. - Follow up in one month to assess response to sertraline .  Insomnia Insomnia related to anxiety and depression. Managed with trazodone , effective for sleep. - Continue trazodone  for sleep.  Follow up one month or sooner if needed.

## 2024-02-19 NOTE — Addendum Note (Signed)
 Addended by: Serafina Damme on: 02/19/2024 06:29 AM   Modules accepted: Orders

## 2024-02-23 ENCOUNTER — Telehealth: Payer: Self-pay

## 2024-02-23 ENCOUNTER — Encounter: Payer: Self-pay | Admitting: Medical

## 2024-02-23 NOTE — Telephone Encounter (Signed)
 Resent letters from previous visit that state pt has follow up counselor and return to work date depending on that

## 2024-02-23 NOTE — Telephone Encounter (Signed)
 Follow up questions from patient's fmla have been placed in red folder , along with copy of fmla    Due 02/27/24

## 2024-03-07 ENCOUNTER — Ambulatory Visit: Admitting: Medical

## 2024-03-07 VITALS — BP 128/98 | HR 76 | Temp 98.0°F | Resp 18 | Ht 62.0 in | Wt 174.0 lb

## 2024-03-07 DIAGNOSIS — F419 Anxiety disorder, unspecified: Secondary | ICD-10-CM | POA: Diagnosis not present

## 2024-03-07 DIAGNOSIS — F4321 Adjustment disorder with depressed mood: Secondary | ICD-10-CM

## 2024-03-07 DIAGNOSIS — F329 Major depressive disorder, single episode, unspecified: Secondary | ICD-10-CM | POA: Diagnosis not present

## 2024-03-07 NOTE — Patient Instructions (Addendum)
 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 Sertraline  ineffective with adverse effects. Trazodone  effective for sleep without side effects. Behavioral health referral initiated. - Discontinue sertraline . - Continue trazodone  50 mg at night for sleep. -Ask you to submit paperwork for behavioral health referral to expedite appointment. - Follow up with behavioral health and psychiatrist. - Provide work documentation for inability to return due to depression and anxiety.  Anxiety Anxiety symptoms accompany depression, affecting work ability. Sertraline  ineffective with adverse effects. Trazodone  aids sleep and may stabilize mood. Behavioral health referral underway. - Discontinue sertraline . - Continue trazodone  50 mg at night for sleep. - Submit paperwork for behavioral health referral to expedite appointment. - Follow up with behavioral health and psychiatrist. - Provide work documentation for inability to return due to depression and anxiety. filled out 7 page form today during the visit.  Follow up one month or sooner if needed

## 2024-03-07 NOTE — Progress Notes (Signed)
 Subjective:    Patient ID: Holly Barnett, female    DOB: [birth year], 48 y.o.   MRN: [id number]  HPI Holly Barnett is a 48 year old female with anxiety and depression who presents with persistent symptoms despite medication.  She experiences ongoing anxiety and depression, which she attributes to the loss of her mother. Despite taking sertraline  25 mg daily since February 13, 2024, her mood has not improved. She experiences side effects from sertraline , including dizziness, nausea, and migraine headaches, which she manages with Excedrin. The headaches occur every time she takes sertraline .  She occasionally takes trazodone  to aid sleep and reports no side effects when taken alone. However, she is cautious about taking trazodone  and Excedrin on the same day as sertraline  to avoid excessive medication use.  She has increased irritability and a desire to avoid being around people, including her immediate family, which is a change from her previous behavior.  Pt has 7 page fmla/disability type form that needs to be filled out today. Have been tyring to get pt in with psychiatrist and counseling. However thus far efforts have failed. Placed referral to [facility] and pt has made referral to other clinic. So far neither office has scheduled pt.   Over past 3-4 month has become obvious that pt needs to be managed by behavioral health/psychiatry. As she continues to express that she feels unable to work due to depression, anxiety, grief and insomnia.    Review of Systems  Constitutional:  Negative for chills, fatigue and fever.  Respiratory:  Negative for cough, chest tightness and wheezing.   Cardiovascular:  Negative for chest pain and palpitations.  Gastrointestinal:  Negative for abdominal pain.  Neurological:  Negative for dizziness, speech difficulty, weakness and light-headedness.  Hematological:  Negative for adenopathy. Does not bruise/bleed easily.  Psychiatric/Behavioral:   Positive for dysphoric mood and sleep disturbance. Negative for behavioral problems and suicidal ideas. The patient is nervous/anxious.     Past Medical History:  Diagnosis Date   Hypertension      Social History   Socioeconomic History   Marital status: Divorced    Spouse name: Not on file   Number of children: Not on file   Years of education: Not on file   Highest education level: Associate degree: occupational, Scientist, product/process development, or vocational program  Occupational History   Occupation: cna  Tobacco Use   Smoking status: Never   Smokeless tobacco: Never  Substance and Sexual Activity   Alcohol use: Yes    Comment: occ   Drug use: No   Sexual activity: Not Currently    Birth control/protection: None  Other Topics Concern   Not on file  Social History Narrative   Not on file   Social Drivers of Health   Financial Resource Strain: Low Risk  (11/22/2023)   Overall Financial Resource Strain (CARDIA)    Difficulty of Paying Living Expenses: Not very hard  Food Insecurity: Food Insecurity Present (11/22/2023)   Hunger Vital Sign    Worried About Running Out of Food in the Last Year: Sometimes true    Ran Out of Food in the Last Year: Never true  Transportation Needs: No Transportation Needs (11/22/2023)   PRAPARE - Administrator, Civil Service (Medical): No    Lack of Transportation (Non-Medical): No  Physical Activity: Unknown (11/22/2023)   Exercise Vital Sign    Days of Exercise per Week: 0 days    Minutes of Exercise per Session: Not on file  Stress: Stress Concern Present (11/22/2023)   [employer] of Occupational Health - Occupational Stress Questionnaire    Feeling of Stress : Very much  Social Connections: Socially Isolated (11/22/2023)   Social Connection and Isolation Panel [NHANES]    Frequency of Communication with Friends and Family: Three times a week    Frequency of Social Gatherings with Friends and Family: Patient declined    Attends Religious  Services: Never    Database administrator or Organizations: No    Attends Engineer, structural: Not on file    Marital Status: Divorced  Catering manager Violence: Not on file    Past Surgical History:  Procedure Laterality Date   BACK SURGERY     CHOLECYSTECTOMY      Family History  Problem Relation Age of Onset   Breast cancer Neg Hx     No Known Allergies  Current Outpatient Medications on File Prior to Visit  Medication Sig Dispense Refill   amLODipine  (NORVASC ) 10 MG tablet Take 1 tablet (10 mg total) by mouth daily. 90 tablet 3   chlorthalidone  (HYGROTON ) 25 MG tablet Take 1 tablet (25 mg total) by mouth daily. 90 tablet 3   traZODone  (DESYREL ) 50 MG tablet Take 0.5-1 tablets (25-50 mg total) by mouth at bedtime as needed for sleep. 30 tablet 3   No current facility-administered medications on file prior to visit.    BP (!) 128/98   Pulse 76   Temp 98 F (36.7 C)   Resp 18   Ht 5\' 2"  (1.575 m)   Wt 174 lb (78.9 kg)   SpO2 99%   BMI 31.83 kg/m        Objective:   Physical Exam  General- No acute distress. Pleasant patient. Neck- Full range of motion, no jvd Lungs- Clear, even and unlabored. Heart- regular rate and rhythm. Neurologic- CNII- XII grossly intact.       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 Sertraline  ineffective with adverse effects. Trazodone  effective for sleep without side effects. Behavioral health referral initiated. - Discontinue sertraline . - Continue trazodone  50 mg at night for sleep. -Ask you to submit paperwork for behavioral health referral to expedite appointment. - Follow up with behavioral health and psychiatrist. - Provide work documentation for inability to return due to depression and anxiety.  Anxiety Anxiety symptoms accompany depression, affecting work ability. Sertraline  ineffective with adverse effects. Trazodone  aids  sleep and may stabilize mood. Behavioral health referral underway. - Discontinue sertraline . - Continue trazodone  50 mg at night for sleep. - Submit paperwork for behavioral health referral to expedite appointment. - Follow up with behavioral health and psychiatrist. - Provide work documentation for inability to return due to depression and anxiety. filled out 7 page form today during the visit.  Follow up one month or sooner if needed  Time spent with patient today was 45  minutes which consisted of chart review, discussing diagnosis,  treatment, filling out 7 page compllicated specialist type form and documentation.(In future anticipate psychiatrist will fill out form again)

## 2024-04-05 ENCOUNTER — Encounter: Payer: Self-pay | Admitting: Medical

## 2024-04-05 ENCOUNTER — Ambulatory Visit: Admitting: Medical

## 2024-04-05 VITALS — BP 130/70 | HR 58 | Temp 98.3°F | Resp 18 | Ht 62.0 in | Wt 177.0 lb

## 2024-04-05 DIAGNOSIS — F4321 Adjustment disorder with depressed mood: Secondary | ICD-10-CM | POA: Diagnosis not present

## 2024-04-05 DIAGNOSIS — F419 Anxiety disorder, unspecified: Secondary | ICD-10-CM

## 2024-04-05 DIAGNOSIS — F329 Major depressive disorder, single episode, unspecified: Secondary | ICD-10-CM

## 2024-04-05 DIAGNOSIS — I10 Essential (primary) hypertension: Secondary | ICD-10-CM | POA: Diagnosis not present

## 2024-04-05 NOTE — Progress Notes (Signed)
 Holly Barnett

## 2024-04-05 NOTE — Patient Instructions (Signed)
 Depression, anxiety, insomnia and grief.  Persistent anxiety, depression, insomnia, irritability, and social withdrawal with minimal improvement. On short-term disability, not ready to return to work. Scheduled psychiatric evaluation on April 09, 2024. Currently on trazodone  and sertraline (though not using daily as advised) - Write a letter stating she is not ready to return to work, return date TBD. - Continue trazodone . - Defer medication adjustments to psychiatrist. - Fill out and fax disability form by next week. - Provide blank form for psychiatrist.  Hypertension Blood pressure improved to 130/70 mmHg. - Continue monitoring blood pressure.  General Health Maintenance No wellness exam since 2023. Recommended scheduling for early fall to address health maintenance. - Schedule wellness exam in September or October 2025. - Perform fasting labs during wellness exam. - Update vaccinations, colonoscopy, and referral fo  gynecological exam.

## 2024-04-05 NOTE — Progress Notes (Signed)
 Subjective:    Patient ID: Holly Barnett, female    DOB: 29-Mar-1976, 48 y.o.   MRN: [id number]  HPI  Pt in for follow up from prior visit on 03-07-2024. See that day comments in .  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 Sertraline  ineffective with adverse effects. Trazodone  effective for sleep without side effects. Behavioral health referral initiated. - Discontinue sertraline . - Continue trazodone  50 mg at night for sleep. -Ask you to submit paperwork for behavioral health referral to expedite appointment. - Follow up with behavioral health and psychiatrist. - Provide work documentation for inability to return due to depression and anxiety.   Anxiety Anxiety symptoms accompany depression, affecting work ability. Sertraline  ineffective with adverse effects. Trazodone  aids sleep and may stabilize mood. Behavioral health referral underway. - Discontinue sertraline . - Continue trazodone  50 mg at night for sleep. - Submit paperwork for behavioral health referral to expedite appointment. - Follow up with behavioral health and psychiatrist. - Provide work documentation for inability to return due to depression and anxiety. filled out 7 page form today during the visit.  Holly Barnett is a 48 year old female who presents with concerns about her disability leave and return to work dates.  She experiences ongoing anxiety, depression, insomnia, and irritability, with a persistent desire to avoid being around people, including her immediate family. These symptoms have been consistent with minimal improvement over time.  She is currently on short-term disability due to her mental health conditions, including depression, anxiety, grief, and insomnia. There is confusion regarding the dates for her return to work, as she has received conflicting information from her employer and the disability leave provider. Her leave was initially not  approved but was later approved after some hassle per pt description. However, there is still a discrepancy in the return-to-work date, with pt stating employer told her needs to return to work. On review I could not find former document stating ready to return to work. In any event she reports not being ready to return.  She is scheduled to see a psychiatrist at [facility] on June 24th. I have been filling out various forms related to her disability leave.  She is currently taking trazodone  and has been advised to take sertraline  for daily anxiety.  She has been reluctant to use sertraline  regularly. Instead, she has been using med on as-needed basis. I advised she can hold any sertraline  use until seeing psychiatrist.  Review of Systems  Constitutional:  Negative for chills, fatigue and fever.  Respiratory:  Negative for cough, chest tightness, shortness of breath and wheezing.   Cardiovascular:  Negative for chest pain and palpitations.  Gastrointestinal:  Negative for abdominal pain, blood in stool and diarrhea.  Genitourinary:  Negative for dysuria and frequency.  Musculoskeletal:  Negative for back pain and myalgias.  Skin:  Negative for rash.  Neurological:  Negative for dizziness, tremors and numbness.  Hematological:  Negative for adenopathy. Does not bruise/bleed easily.  Psychiatric/Behavioral:  Negative for behavioral problems, decreased concentration, self-injury and suicidal ideas.     Past Medical History:  Diagnosis Date   Hypertension      Social History   Socioeconomic History   Marital status: Divorced    Spouse name: Not on file   Number of children: Not on file   Years of education: Not on file   Highest education level: Associate degree: occupational, Scientist, product/process development, or vocational program  Occupational History   Occupation: cna  Tobacco Use   Smoking status: Never   Smokeless tobacco: Never  Substance and Sexual Activity   Alcohol use: Yes    Comment:  occ   Drug use: No   Sexual activity: Not Currently    Birth control/protection: None  Other Topics Concern   Not on file  Social History Narrative   Not on file   Social Drivers of Health   Financial Resource Strain: Low Risk  (11/22/2023)   Overall Financial Resource Strain (CARDIA)    Difficulty of Paying Living Expenses: Not very hard  Food Insecurity: Food Insecurity Present (11/22/2023)   Hunger Vital Sign    Worried About Running Out of Food in the Last Year: Sometimes true    Ran Out of Food in the Last Year: Never true  Transportation Needs: No Transportation Needs (11/22/2023)   PRAPARE - Administrator, Civil Service (Medical): No    Lack of Transportation (Non-Medical): No  Physical Activity: Unknown (11/22/2023)   Exercise Vital Sign    Days of Exercise per Week: 0 days    Minutes of Exercise per Session: Not on file  Stress: Stress Concern Present (11/22/2023)   [employer] of Occupational Health - Occupational Stress Questionnaire    Feeling of Stress : Very much  Social Connections: Socially Isolated (11/22/2023)   Social Connection and Isolation Panel    Frequency of Communication with Friends and Family: Three times a week    Frequency of Social Gatherings with Friends and Family: Patient declined    Attends Religious Services: Never    Database administrator or Organizations: No    Attends Engineer, structural: Not on file    Marital Status: Divorced  Catering manager Violence: Not on file    Past Surgical History:  Procedure Laterality Date   BACK SURGERY     CHOLECYSTECTOMY      Family History  Problem Relation Age of Onset   Breast cancer Neg Hx     No Known Allergies  Current Outpatient Medications on File Prior to Visit  Medication Sig Dispense Refill   amLODipine  (NORVASC ) 10 MG tablet Take 1 tablet (10 mg total) by mouth daily. 90 tablet 3   chlorthalidone  (HYGROTON ) 25 MG tablet Take 1 tablet (25 mg total) by mouth  daily. 90 tablet 3   traZODone  (DESYREL ) 50 MG tablet Take 0.5-1 tablets (25-50 mg total) by mouth at bedtime as needed for sleep. 30 tablet 3   No current facility-administered medications on file prior to visit.    BP (!) 148/80   Pulse (!) 58   Temp 98.3 F (36.8 C)   Resp 18   Ht 5' 2 (1.575 m)   Wt 177 lb (80.3 kg)   SpO2 97%   BMI 32.37 kg/m        Objective:   Physical Exam  General Mental Status- Alert. General Appearance- Not in acute distress.   Skin General: Color- Normal Color. Moisture- Normal Moisture.  Neck Carotid Arteries- Normal color. Moisture- Normal Moisture. No carotid bruits. No JVD.  Chest and Lung Exam Auscultation: Breath Sounds:-CTA  Cardiovascular Auscultation:Rythm- RRR Murmurs & Other Heart Sounds:Auscultation of the heart reveals- No Murmurs.  Abdomen Inspection:-Inspeection Normal. Palpation/Percussion:Note:No mass. Palpation and Percussion of the abdomen reveal- Non Tender, Non Distended + BS, no rebound or guarding.   Neurologic Cranial Nerve exam:- CN III-XII intact(No nystagmus), symmetric smile. Strength:- 5/5 equal and symmetric strength both upper and lower extremities.  Assessment & Plan:   Patient Instructions  Depression, anxiety, insomnia and grief.  Persistent anxiety, depression, insomnia, irritability, and social withdrawal with minimal improvement. On short-term disability, not ready to return to work. Scheduled psychiatric evaluation on April 09, 2024. Currently on trazodone  and sertraline (though not using daily as advised) - Write a letter stating she is not ready to return to work, return date TBD. - Continue trazodone . - Defer medication adjustments to psychiatrist. - Fill out and fax disability form by next week. - Provide blank form for psychiatrist.  Hypertension Blood pressure improved to 130/70 mmHg. - Continue monitoring blood pressure.  General Health Maintenance No wellness exam since  2023. Recommended scheduling for early fall to address health maintenance. - Schedule wellness exam in September or October 2025. - Perform fasting labs during wellness exam. - Update vaccinations, colonoscopy, and referral fo  gynecological exam.   Time spent with patient today was 40  minutes which consisted of chart review, discussing diagnosis, work up,  prior forms,  treatment and documentation. Discussion with patient on how would transition to behavioral health and how paperwork would be filled out going forward.

## 2024-04-10 ENCOUNTER — Other Ambulatory Visit: Payer: Self-pay | Admitting: Medical

## 2024-04-10 ENCOUNTER — Telehealth: Payer: Self-pay | Admitting: Medical

## 2024-04-10 NOTE — Telephone Encounter (Signed)
 Forms faxed

## 2024-04-10 NOTE — Telephone Encounter (Signed)
 Please fax the psychiatric form filled out.

## 2024-05-17 ENCOUNTER — Encounter: Admitting: Medical

## 2024-05-23 ENCOUNTER — Encounter: Payer: Self-pay | Admitting: Medical

## 2024-05-23 ENCOUNTER — Ambulatory Visit: Admitting: Medical

## 2024-06-04 ENCOUNTER — Telehealth: Payer: Self-pay

## 2024-06-04 NOTE — Telephone Encounter (Signed)
 FMLA forms receieved , pt sent mychart message to r/s CPE from 05/23/24 and discuss forms

## 2024-06-11 ENCOUNTER — Encounter: Admitting: Medical

## 2024-11-04 ENCOUNTER — Other Ambulatory Visit (HOSPITAL_COMMUNITY)
Admission: RE | Admit: 2024-11-04 | Discharge: 2024-11-04 | Disposition: A | Discharge status: Home / Self Care | Source: Ambulatory Visit | Attending: Medical | Admitting: Medical

## 2024-11-04 ENCOUNTER — Ambulatory Visit: Admitting: Medical

## 2024-11-04 ENCOUNTER — Ambulatory Visit: Payer: Self-pay | Admitting: Medical

## 2024-11-04 ENCOUNTER — Encounter: Payer: Self-pay | Admitting: Medical

## 2024-11-04 VITALS — BP 135/90 | HR 83 | Temp 98.1°F | Resp 15 | Ht 62.0 in | Wt 178.0 lb

## 2024-11-04 DIAGNOSIS — Z113 Encounter for screening for infections with a predominantly sexual mode of transmission: Secondary | ICD-10-CM | POA: Insufficient documentation

## 2024-11-04 DIAGNOSIS — R300 Dysuria: Secondary | ICD-10-CM

## 2024-11-04 DIAGNOSIS — N898 Other specified noninflammatory disorders of vagina: Secondary | ICD-10-CM | POA: Insufficient documentation

## 2024-11-04 DIAGNOSIS — I10 Essential (primary) hypertension: Secondary | ICD-10-CM | POA: Diagnosis not present

## 2024-11-04 LAB — POC URINALSYSI DIPSTICK (AUTOMATED)
Blood, UA: NEGATIVE
Glucose, UA: NEGATIVE
Ketones, UA: NEGATIVE
Leukocytes, UA: NEGATIVE
Nitrite, UA: NEGATIVE
Protein, UA: NEGATIVE
Spec Grav, UA: 1.01
Urobilinogen, UA: 0.2 U/dL
pH, UA: 6.5

## 2024-11-04 MED ORDER — FLUCONAZOLE 150 MG PO TABS: 150.0000 mg | ORAL_TABLET | Freq: Every day | ORAL | 0 refills | Status: AC

## 2024-11-04 MED ORDER — AMLODIPINE BESYLATE 5 MG PO TABS: 5.0000 mg | ORAL_TABLET | Freq: Every day | ORAL | 3 refills | Status: AC

## 2024-11-04 NOTE — Progress Notes (Signed)
 Urine clture drawn up by myself and labeled. Lab just needs to send it off

## 2024-11-04 NOTE — Progress Notes (Signed)
" ° °  Subjective:    Patient ID: Holly Barnett, female    DOB: May 03, 1976, 49 y.o.   MRN: [id number]  HPI   Jordi Kamm is a 49 year old female who presents with white vaginal discharge.  She has had irritating white vaginal discharge for 12 days, which started after using a new soap. She is worried about sexually transmitted infection, though the timing does not match recent sexual activit preceding dc(no associated std type symptoms). She has very mild dysuria and denies back pain, fevers, chills, sweats, or bladder pain. She has hypertension and has not been taking her medications consistently.      Review of Systems See hpi    Objective:   Physical Exam  General- No acute distress. Pleasant patient. Neck- Full range of motion, no jvd Lungs- Clear, even and unlabored. Heart- regular rate and rhythm. Neurologic- CNII- XII grossly intact.  Back- no cva pain on palpation Abdomen- no suprapubic tenderness to palpation.+bs, no rebound or guarding. No organomegally.       Assessment & Plan:   Vaginal irritation with white dc. Mild dysuria Likely yeast infection, possibly triggered by new soap use. - Pt self swab vaginal ancillary studies. - Obtained urine sample for culture. - Prescribed Diflucan  150 mg, one tablet for one day.  Screening for sexually transmitted infections STD testing requested despite symptom timing not correlating with recent sexual activity.  Essential hypertension Hypertension with sporadic medication adherence, current BP 135/90 mmHg. - Restarted amlodipine  5 mg daily. - Advised low salt diet. - Recommended regular exercise. - Scheduled follow-up in one month for wellness exam and labs. "

## 2024-11-04 NOTE — Patient Instructions (Signed)
 Vaginal irritation with white dc. Mild dysuria Likely yeast infection, possibly triggered by new soap use. - Pt self swab vaginal ancillary studies. - Obtained urine sample for culture. - Prescribed Diflucan  150 mg, one tablet for one day.  Screening for sexually transmitted infections STD testing requested despite symptom timing not correlating with recent sexual activity.  Essential hypertension Hypertension with sporadic medication adherence, current BP 135/90 mmHg. - Restarted amlodipine  5 mg daily. - Advised low salt diet. - Recommended regular exercise. - Scheduled follow-up in one month for wellness exam and labs.

## 2024-11-05 LAB — CERVICOVAGINAL ANCILLARY ONLY
Bacterial Vaginitis (gardnerella): NEGATIVE
Candida Glabrata: NEGATIVE
Candida Vaginitis: NEGATIVE
Chlamydia: NEGATIVE
Comment: NEGATIVE
Comment: NEGATIVE
Comment: NEGATIVE
Comment: NEGATIVE
Comment: NEGATIVE
Comment: NORMAL
Neisseria Gonorrhea: NEGATIVE
Trichomonas: NEGATIVE

## 2024-11-05 LAB — URINE CULTURE
MICRO NUMBER:: 17485718
SPECIMEN QUALITY:: ADEQUATE

## 2024-12-02 ENCOUNTER — Encounter: Admitting: Medical
